# Patient Record
Sex: Male | Born: 1975 | Race: White | Hispanic: No | Marital: Single | Smoking: Current every day smoker
Health system: Southern US, Community
[De-identification: ages and names within clinical notes are randomized; demographics above are authoritative.]

## PROBLEM LIST (undated history)

## (undated) DIAGNOSIS — J9819 Other pulmonary collapse: Secondary | ICD-10-CM

## (undated) DIAGNOSIS — F1010 Alcohol abuse, uncomplicated: Secondary | ICD-10-CM

## (undated) DIAGNOSIS — I10 Essential (primary) hypertension: Secondary | ICD-10-CM

## (undated) DIAGNOSIS — K529 Noninfective gastroenteritis and colitis, unspecified: Secondary | ICD-10-CM

## (undated) DIAGNOSIS — R011 Cardiac murmur, unspecified: Secondary | ICD-10-CM

## (undated) DIAGNOSIS — R569 Unspecified convulsions: Secondary | ICD-10-CM

## (undated) DIAGNOSIS — R945 Abnormal results of liver function studies: Secondary | ICD-10-CM

## (undated) DIAGNOSIS — R7989 Other specified abnormal findings of blood chemistry: Secondary | ICD-10-CM

## (undated) DIAGNOSIS — F418 Other specified anxiety disorders: Secondary | ICD-10-CM

## (undated) HISTORY — PX: FOOT SURGERY: SHX648

---

## 1999-09-26 DIAGNOSIS — J9819 Other pulmonary collapse: Secondary | ICD-10-CM

## 1999-09-26 HISTORY — DX: Other pulmonary collapse: J98.19

## 2010-02-13 ENCOUNTER — Ambulatory Visit: Payer: Self-pay | Admitting: Occupational Medicine

## 2010-02-13 DIAGNOSIS — F411 Generalized anxiety disorder: Secondary | ICD-10-CM | POA: Insufficient documentation

## 2010-02-13 DIAGNOSIS — M25539 Pain in unspecified wrist: Secondary | ICD-10-CM | POA: Insufficient documentation

## 2010-02-13 DIAGNOSIS — I10 Essential (primary) hypertension: Secondary | ICD-10-CM | POA: Insufficient documentation

## 2010-09-14 ENCOUNTER — Encounter
Admission: RE | Admit: 2010-09-14 | Discharge: 2010-09-14 | Payer: Self-pay | Source: Home / Self Care | Attending: Sports Medicine | Admitting: Sports Medicine

## 2010-10-25 NOTE — Assessment & Plan Note (Signed)
Summary: L Hand pain x 1 wk rm 1   Vital Signs:  Patient Profile:   35 Years Old Male CC:      L Hand pain x 1 wk Height:     69 inches Weight:      203 pounds O2 Sat:      100 % O2 treatment:    Room Air Temp:     98.0 degrees F oral Pulse rate:   85 / minute Pulse rhythm:   regular Resp:     16 per minute BP sitting:   148 / 99  (right arm) Cuff size:   regular  Vitals Entered By: Areta Haber CMA (Feb 13, 2010 2:14 PM)                  Current Allergies: No known allergies History of Present Illness Chief Complaint: L Hand pain x 1 wk History of Present Illness: One week ago the patient has been drinking heavily and accidentally fell down the basement stairs at his home.  He injured his left wrist.   Throughout the week he has been having left wrist pain.  He is unemployed, so he did not have to go to work.     Presents with complaints of left wrist pain mainly over ulnar wrist and 5th metacarpal.  No bruising.   Wrist and hand are swollen and he is unable to make a composite fist.   Cap refill is normal.   When I looked him up on the [place] narcotics registry, he regularly takes suboxone and was prescribed 24 suboxone on 02/04/10  Current Problems: WRIST PAIN, LEFT (ICD-719.43) FAMILY HISTORY BREAST CANCER 1ST DEGREE RELATIVE <50 (ICD-V16.3) HYPERTENSION (ICD-401.9) ANXIETY (ICD-300.00)   REVIEW OF SYSTEMS Constitutional Symptoms      Denies fever, chills, night sweats, weight loss, weight gain, and fatigue.  Eyes       Denies change in vision, eye pain, eye discharge, glasses, contact lenses, and eye surgery. Ear/Nose/Throat/Mouth       Denies hearing loss/aids, change in hearing, ear pain, ear discharge, dizziness, frequent runny nose, frequent nose bleeds, sinus problems, sore throat, hoarseness, and tooth pain or bleeding.  Respiratory       Denies dry cough, productive cough, wheezing, shortness of breath, asthma, bronchitis, and emphysema/COPD.    Cardiovascular       Denies murmurs, chest pain, and tires easily with exhertion.    Gastrointestinal       Denies stomach pain, nausea/vomiting, diarrhea, constipation, blood in bowel movements, and indigestion. Genitourniary       Denies painful urination, kidney stones, and loss of urinary control. Neurological       Denies paralysis, seizures, and fainting/blackouts. Musculoskeletal       Complains of decreased range of motion.      Denies muscle pain, joint pain, joint stiffness, redness, swelling, muscle weakness, and gout.      Comments: L Hand pain x 1 wk Skin       Denies bruising, unusual mles/lumps or sores, and hair/skin or nail changes.  Psych       Denies mood changes, temper/anger issues, anxiety/stress, speech problems, depression, and sleep problems. Other Comments: Pt states that he fell down  a flight of stairs x wk ago. Pt states he has not had his hand seen by PCP.   Past History:  Past Medical History: Anxiety Hypertension  Past Surgical History: foot surgery  Family History: Family History Breast cancer 1st degree relative <50  Family History Hypertension Family History Other cancer  Social History: Single Current Smoker - 1 pack daily Alcohol use-yes - six pack daily Drug use-no Regular exercise-no Smoking Status:  current Drug Use:  no Does Patient Exercise:  no Physical Exam Chest/Lungs: no rales, wheezes, or rhonchi bilateral, breath sounds equal without effort Heart: regular rate and  rhythm, no murmur Extremities: Presents with complaints of left wrist pain mainly over ulnar wrist and 5th metacarpal.  No bruising.   Wrist and hand are swollen and he is unable to make a composite fist.   Cap refill is normal. Very limited range of motion to the left wrist due to pain and swelling.   Assessment New Problems: WRIST PAIN, LEFT (ICD-719.43) FAMILY HISTORY BREAST CANCER 1ST DEGREE RELATIVE <50 (ICD-V16.3) HYPERTENSION (ICD-401.9) ANXIETY  (ICD-300.00)   Plan New Orders: New Patient Level II [99202] T-DG Wrist Complete*L* [73110] Planning Comments:   Findings: Tiny corticated ossicle projects dorsal to the triquetrum.  Carpal rows intact. Negative for acute fracture, dislocation, or other acute abnormality.  Normal alignment and mineralization. No significant degenerative change.  Regional soft tissues unremarkable.   IMPRESSION:   No acute abnormality.  Recommend Cock up wrist splint Ibuprofen OTC for pain Ice and elevation Follow up with Ortho if no better in 1 week   The patient and/or caregiver has been counseled thoroughly with regard to medications prescribed including dosage, schedule, interactions, rationale for use, and possible side effects and they verbalize understanding.  Diagnoses and expected course of recovery discussed and will return if not improved as expected or if the condition worsens. Patient and/or caregiver verbalized understanding.

## 2012-05-10 ENCOUNTER — Emergency Department (HOSPITAL_BASED_OUTPATIENT_CLINIC_OR_DEPARTMENT_OTHER)
Admission: EM | Admit: 2012-05-10 | Discharge: 2012-05-10 | Disposition: A | Discharge status: Home / Self Care | Payer: Self-pay | Attending: Emergency Medicine | Admitting: Emergency Medicine

## 2012-05-10 ENCOUNTER — Encounter (HOSPITAL_BASED_OUTPATIENT_CLINIC_OR_DEPARTMENT_OTHER): Payer: Self-pay | Admitting: *Deleted

## 2012-05-10 DIAGNOSIS — F17200 Nicotine dependence, unspecified, uncomplicated: Secondary | ICD-10-CM | POA: Insufficient documentation

## 2012-05-10 DIAGNOSIS — I10 Essential (primary) hypertension: Secondary | ICD-10-CM | POA: Insufficient documentation

## 2012-05-10 HISTORY — DX: Noninfective gastroenteritis and colitis, unspecified: K52.9

## 2012-05-10 HISTORY — DX: Unspecified convulsions: R56.9

## 2012-05-10 HISTORY — DX: Other specified abnormal findings of blood chemistry: R79.89

## 2012-05-10 HISTORY — DX: Essential (primary) hypertension: I10

## 2012-05-10 HISTORY — DX: Other pulmonary collapse: J98.19

## 2012-05-10 HISTORY — DX: Abnormal results of liver function studies: R94.5

## 2012-05-10 HISTORY — DX: Alcohol abuse, uncomplicated: F10.10

## 2012-05-10 HISTORY — DX: Cardiac murmur, unspecified: R01.1

## 2012-05-10 HISTORY — DX: Other specified anxiety disorders: F41.8

## 2012-05-10 LAB — CBC
HCT: 46 % (ref 39.0–52.0)
Platelets: 252 10*3/uL (ref 150–400)
RBC: 5.15 MIL/uL (ref 4.22–5.81)
WBC: 9.8 10*3/uL (ref 4.0–10.5)

## 2012-05-10 LAB — BASIC METABOLIC PANEL
CO2: 25 mEq/L (ref 19–32)
Calcium: 9.9 mg/dL (ref 8.4–10.5)
Chloride: 101 mEq/L (ref 96–112)
Creatinine, Ser: 0.8 mg/dL (ref 0.50–1.35)
Glucose, Bld: 107 mg/dL — ABNORMAL HIGH (ref 70–99)

## 2012-05-10 MED ORDER — CLONIDINE HCL 0.1 MG PO TABS
ORAL_TABLET | ORAL | Status: AC
  Administered 2012-05-10: 0.1 mg via ORAL
  Filled 2012-05-10: qty 1

## 2012-05-10 MED ORDER — CLONIDINE HCL 0.1 MG PO TABS: 0.1000 mg | ORAL_TABLET | Freq: Two times a day (BID) | ORAL | Status: DC

## 2012-05-10 MED ORDER — CLONIDINE HCL 0.1 MG PO TABS
0.1000 mg | ORAL_TABLET | Freq: Once | ORAL | Status: AC
  Administered 2012-05-10: 0.1 mg via ORAL

## 2012-05-10 NOTE — ED Notes (Signed)
Patient has multiple symptoms.  During conversation new complaints arise.  Vaguely mentions chest pain, left arm pain, and SOB.

## 2012-05-10 NOTE — ED Notes (Signed)
Patient has history of elevated bp.  Currently in rehab at [facility] for the last 2 days.  States he was at [facility] for detox prior to going home for 4 days.  States he continued to drink at home and entered [facility] 48 hours ago.  States his bp was elevated on arrival to [facility], rechecked this morning and B/P was 170/ 116.  States he has had intermittent headache, lightheadedness and shakes.  States he was on clonidine for treatment of bp, has not taken in 2-3 months.

## 2012-05-10 NOTE — ED Notes (Signed)
States he has anxiety and doesn't think anything helps that.
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
## 2013-05-07 ENCOUNTER — Emergency Department (INDEPENDENT_AMBULATORY_CARE_PROVIDER_SITE_OTHER)
Admission: EM | Admit: 2013-05-07 | Discharge: 2013-05-07 | Disposition: A | Discharge status: Home / Self Care | Payer: Self-pay | Source: Home / Self Care | Attending: Family Medicine | Admitting: Family Medicine

## 2013-05-07 ENCOUNTER — Encounter: Payer: Self-pay | Admitting: *Deleted

## 2013-05-07 ENCOUNTER — Emergency Department (INDEPENDENT_AMBULATORY_CARE_PROVIDER_SITE_OTHER): Payer: Self-pay

## 2013-05-07 ENCOUNTER — Other Ambulatory Visit: Payer: Self-pay | Admitting: Family Medicine

## 2013-05-07 DIAGNOSIS — J189 Pneumonia, unspecified organism: Secondary | ICD-10-CM

## 2013-05-07 LAB — POCT URINALYSIS DIPSTICK
Blood, UA: NEGATIVE
Glucose, UA: NEGATIVE
Ketones, UA: NEGATIVE
Nitrite, UA: NEGATIVE
Urobilinogen, UA: >=8 (ref 0–1)

## 2013-05-07 LAB — POCT CBC W AUTO DIFF (K'VILLE URGENT CARE)

## 2013-05-07 LAB — POCT MONO SCREEN (KUC): Mono, POC: NEGATIVE

## 2013-05-07 MED ORDER — LEVOFLOXACIN 500 MG PO TABS: 500.0000 mg | ORAL_TABLET | Freq: Every day | ORAL | Status: DC

## 2013-05-07 MED ORDER — CEFTRIAXONE SODIUM 1 G IJ SOLR: 1.0000 g | Freq: Once | INTRAMUSCULAR | Status: DC

## 2013-05-07 MED ORDER — BENZONATATE 100 MG PO CAPS: ORAL_CAPSULE | ORAL | Status: DC

## 2013-05-07 MED ORDER — CEFTRIAXONE SODIUM 1 G IJ SOLR
1.0000 g | Freq: Once | INTRAMUSCULAR | Status: AC
  Administered 2013-05-07: 1 g via INTRAMUSCULAR
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
## 2013-05-07 NOTE — ED Notes (Addendum)
Bobby Shaw c/o 4 days body aches, fever, productive cough. Rash to trunk x 2-3 days, itches. T-max 104

## 2013-05-08 ENCOUNTER — Telehealth: Payer: Self-pay | Admitting: *Deleted

## 2016-06-20 ENCOUNTER — Ambulatory Visit (INDEPENDENT_AMBULATORY_CARE_PROVIDER_SITE_OTHER): Payer: Self-pay | Admitting: Osteopathic Medicine

## 2016-06-20 ENCOUNTER — Other Ambulatory Visit: Payer: Self-pay | Admitting: Osteopathic Medicine

## 2016-06-20 ENCOUNTER — Encounter: Payer: Self-pay | Admitting: Osteopathic Medicine

## 2016-06-20 VITALS — BP 167/109 | HR 104 | Ht 69.0 in

## 2016-06-20 DIAGNOSIS — R7989 Other specified abnormal findings of blood chemistry: Secondary | ICD-10-CM

## 2016-06-20 DIAGNOSIS — R030 Elevated blood-pressure reading, without diagnosis of hypertension: Secondary | ICD-10-CM

## 2016-06-20 DIAGNOSIS — R748 Abnormal levels of other serum enzymes: Secondary | ICD-10-CM

## 2016-06-20 DIAGNOSIS — IMO0001 Reserved for inherently not codable concepts without codable children: Secondary | ICD-10-CM

## 2016-06-20 DIAGNOSIS — T79A21S Traumatic compartment syndrome of right lower extremity, sequela: Secondary | ICD-10-CM

## 2016-06-20 LAB — CBC WITH DIFFERENTIAL/PLATELET
BASOS ABS: 0 {cells}/uL (ref 0–200)
Basophils Relative: 0 %
EOS PCT: 0 %
Eosinophils Absolute: 0 cells/uL — ABNORMAL LOW (ref 15–500)
HCT: 36.5 % — ABNORMAL LOW (ref 38.5–50.0)
HEMOGLOBIN: 11.8 g/dL — AB (ref 13.2–17.1)
LYMPHS PCT: 26 %
Lymphs Abs: 2106 cells/uL (ref 850–3900)
MCH: 29.1 pg (ref 27.0–33.0)
MCHC: 32.3 g/dL (ref 32.0–36.0)
MCV: 90.1 fL (ref 80.0–100.0)
MONOS PCT: 7 %
MPV: 9.4 fL (ref 7.5–12.5)
Monocytes Absolute: 567 cells/uL (ref 200–950)
NEUTROS PCT: 67 %
Neutro Abs: 5427 cells/uL (ref 1500–7800)
PLATELETS: 460 10*3/uL — AB (ref 140–400)
RBC: 4.05 MIL/uL — AB (ref 4.20–5.80)
RDW: 14.8 % (ref 11.0–15.0)
WBC: 8.1 10*3/uL (ref 3.8–10.8)

## 2016-06-20 LAB — COMPLETE METABOLIC PANEL WITH GFR
ALBUMIN: 4.3 g/dL (ref 3.6–5.1)
ALK PHOS: 79 U/L (ref 40–115)
ALT: 13 U/L (ref 9–46)
AST: 25 U/L (ref 10–40)
BUN: 10 mg/dL (ref 7–25)
CHLORIDE: 99 mmol/L (ref 98–110)
CO2: 23 mmol/L (ref 20–31)
Calcium: 10 mg/dL (ref 8.6–10.3)
Creat: 0.98 mg/dL (ref 0.60–1.35)
GFR, Est African American: >89 mL/min (ref >=60)
GLUCOSE: 107 mg/dL — AB (ref 65–99)
POTASSIUM: 4.8 mmol/L (ref 3.5–5.3)
SODIUM: 140 mmol/L (ref 135–146)
Total Bilirubin: 0.5 mg/dL (ref 0.2–1.2)
Total Protein: 7.4 g/dL (ref 6.1–8.1)

## 2016-06-20 MED ORDER — HYDROCODONE-ACETAMINOPHEN 5-325 MG PO TABS: 1.0000 | ORAL_TABLET | ORAL | 0 refills | Status: DC | PRN

## 2016-06-20 MED ORDER — GABAPENTIN 600 MG PO TABS: 600.0000 mg | ORAL_TABLET | Freq: Three times a day (TID) | ORAL | 1 refills | Status: DC | PRN

## 2016-06-20 NOTE — Patient Instructions (Signed)
Plan:  Pain management for now with hydrocodone for severe pain, gabapentin for nerve pain. We'll keep gabapentin at 600 mg 3 times per day for now until kidney function shows some improvement.   Labs to monitor for medication safety, monitor kidney function.  Need records from your hospital stay/surgical records  Plan to follow-up in one to 2 weeks, may ask for lab draw sooner if kidney function is not showing improvement
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
## 2016-06-21 ENCOUNTER — Telehealth: Payer: Self-pay

## 2016-06-21 NOTE — Telephone Encounter (Signed)
Pt reports the pain meds that was Rx yesterday is not helping. Please advise.

## 2016-06-21 NOTE — Telephone Encounter (Signed)
No, I told him if his pain is so severe that he cannot cope at home, he should go to the hospital because he may require admission for pain management, consultation with surgeon, etc.

## 2016-06-21 NOTE — Telephone Encounter (Signed)
Pt called clinic again stating it was mentioned to him at [facility] yesterday there was a program where he could be admitted to the hospital to learn how to manage his pain. Questions if this is really an option. Will route.   Pt's cell: [phone]

## 2016-06-21 NOTE — Telephone Encounter (Signed)
Spoke to patient gave him advise as noted below. Rhonda Cunningham,CMA  

## 2016-06-27 ENCOUNTER — Ambulatory Visit (INDEPENDENT_AMBULATORY_CARE_PROVIDER_SITE_OTHER): Payer: Medicaid Other | Admitting: Sports Medicine

## 2016-06-27 DIAGNOSIS — M79604 Pain in right leg: Secondary | ICD-10-CM | POA: Diagnosis not present

## 2016-06-27 MED ORDER — PREDNISONE 50 MG PO TABS: ORAL_TABLET | ORAL | 0 refills | Status: DC

## 2016-06-27 MED ORDER — AMITRIPTYLINE HCL 50 MG PO TABS: ORAL_TABLET | ORAL | 3 refills | Status: DC

## 2016-06-27 NOTE — Assessment & Plan Note (Signed)
History of extensive fasciotomy for posterior thigh compartment syndrome a month ago. I don't have any records, they will get records sent to us. He is having severe pain running down the leg, in a sciatic distribution but not really a radicular distribution. There is also significant swelling in the right foot. Adding prednisone, continue gabapentin 600 mg 4 times a day, adding amitriptyline 50 mg at bedtime. Referral placed to pain management, I would like a nerve conduction and EMG of the right lower extremity. We have been very clear that we will be avoiding narcotic pain management. Also checking some blood work including B12 levels, serum drug screen.

## 2016-06-27 NOTE — Progress Notes (Signed)
   Subjective:    I'm seeing this patient as a consultation for:  Dr. Sunnie NielsenNatalie Shaw   CC: burning R foot pain s/p R thigh fasciotomy one month ago  HPI: 40 yo now one month s/p R thigh fasciotomy presenting with three days of constant burning R foot pain.  Patient describes pain as a constant burning from the tip of his toes up to his ankle in his R foot.  He says when he tries bearing weight, the burning intensifies and it feels like there are shooting pains throughout his foot.  These symptoms started three days ago.  He also thinks his foot is swollen, but it does not change colors.  He says pain is unbearable and gabapentin does not help.  He says Percocet provides minimal relief.  He has trouble sleeping at night due to pain.  He denies any fevers, chills, night sweats. He never had follow-up with his surgeon after the surgery because [place] in [place] and he moved down to [place] to live with his mom following discharge.  He does note that the surgeon said something about "possible sciatic nerve injury" but it is unclear if there was any injury to the sciatic nerve during the surgery.  Patient's mother will work on getting records from hospital for review.   Past medical history:  Negative.  See flowsheet/record as well for more information.  Surgical history: Negative.  See flowsheet/record as well for more information.  Family history: Negative.  See flowsheet/record as well for more information.  Social history: Negative.  See flowsheet/record as well for more information.  Allergies, and medications have been entered into the medical record, reviewed, and no changes needed.   Review of Systems: No headache, visual changes, nausea, vomiting, diarrhea, constipation, dizziness, abdominal pain, skin rash, fevers, chills, night sweats, weight loss, swollen lymph nodes, body aches, joint swelling, muscle aches, chest pain, shortness of breath, mood changes, visual or  auditory hallucinations.   Objective:   General: Well Developed, well nourished, and in no acute distress.  Neuro/Psych: Alert and oriented x3, extra-ocular muscles intact, able to move all 4 extremities, sensation grossly intact. Skin: Warm and dry, no rashes noted. Well-healing fasciotomy scars on thigh.   Respiratory: Not using accessory muscles, speaking in full sentences, trachea midline. Lungs clear bilaterally.  Cardiovascular: Tachycardic, regular rhythm.  No murmurs.  Pulses palpable, no extremity edema. Abdomen: Does not appear distended. R Foot: Swelling of R foot.  Range of motion is full in all directions. Strength is 5/5 in all directions. No hallux valgus. No pes cavus or pes planus. No abnormal callus noted.  Dry skin on dorsal aspect of foot.  No pain over the navicular prominence, or base of fifth metatarsal. Tender to palpation throughout, worse over first metatarsal head. Neurovascularly intact distally. 2+ DP pulse.     Impression and Recommendations:   This case required medical decision making of moderate complexity.  1. Neuropathic pain s/p R thigh fasciotomy 1 month ago:  Patient's mother will work on getting outside records from his surgery and hospitalization, which may help determine cause of pain.  Patient is also insistent that he be given narcotics for pain, although we will not give them to him at this time.   -Course of prednisone -Continue gabapentin 600mg  four times per day -Amitrypttiline 50mg  at night -Nerve conduction studies/EMG  -Referral to pain management

## 2016-06-28 LAB — COMPREHENSIVE METABOLIC PANEL
ALT: 16 U/L (ref 9–46)
AST: 29 U/L (ref 10–40)
Alkaline Phosphatase: 91 U/L (ref 40–115)
BUN: 9 mg/dL (ref 7–25)
CO2: 25 mmol/L (ref 20–31)
Glucose, Bld: 80 mg/dL (ref 65–99)
Potassium: 4.2 mmol/L (ref 3.5–5.3)

## 2016-06-28 LAB — COMPREHENSIVE METABOLIC PANEL WITH GFR
Albumin: 4 g/dL (ref 3.6–5.1)
Calcium: 9.4 mg/dL (ref 8.6–10.3)
Chloride: 100 mmol/L (ref 98–110)
Creat: 0.73 mg/dL (ref 0.60–1.35)
Sodium: 138 mmol/L (ref 135–146)
Total Bilirubin: 0.5 mg/dL (ref 0.2–1.2)
Total Protein: 6.8 g/dL (ref 6.1–8.1)

## 2016-06-28 LAB — CBC
HCT: 34.5 % — ABNORMAL LOW (ref 38.5–50.0)
Hemoglobin: 11 g/dL — ABNORMAL LOW (ref 13.2–17.1)
MCH: 28.6 pg (ref 27.0–33.0)
MCHC: 31.9 g/dL — ABNORMAL LOW (ref 32.0–36.0)
MCV: 89.6 fL (ref 80.0–100.0)
MPV: 9.5 fL (ref 7.5–12.5)
Platelets: 310 10*3/uL (ref 140–400)
RBC: 3.85 MIL/uL — ABNORMAL LOW (ref 4.20–5.80)
RDW: 14.3 % (ref 11.0–15.0)
WBC: 8.9 10*3/uL (ref 3.8–10.8)

## 2016-06-28 LAB — CK: Total CK: 249 U/L — ABNORMAL HIGH (ref 7–232)

## 2016-06-28 LAB — VITAMIN B12: Vitamin B-12: 378 pg/mL (ref 200–1100)

## 2016-06-28 LAB — HEMOGLOBIN A1C
Hgb A1c MFr Bld: 4.4 % (ref <5.7)
Mean Plasma Glucose: 80 mg/dL

## 2016-06-29 ENCOUNTER — Telehealth: Payer: Self-pay

## 2016-06-29 LAB — HEAVY METALS PANEL, BLOOD
Arsenic: 4 mcg/L (ref <23)
Lead: <1 ug/dL (ref <5)
Mercury, B: <4 mcg/L (ref <=10)

## 2016-06-29 NOTE — Telephone Encounter (Signed)
Noted. I did ask him about what brought his compartment syndrome on (why on the ground so long...), he did admit to some alcohol abuse but states that he's been sober since his hospitalization. I would also probably address this further with him if/when he follows up with me. Would agree that pain management is the best option for him

## 2016-06-29 NOTE — Telephone Encounter (Signed)
Pt is unable to go to pain management due to Medicaid pending. Please advise on where else he can be seen.

## 2016-06-29 NOTE — Telephone Encounter (Signed)
Leta: He will have to pay cash to see them until his insurance goes through. Other options include trying a different pain management clinic, possibly [facility].  To Dorene Grebeatalie:  I would recommend avoiding treatment of his symptoms with controlled substances unless you are willing to do his medical pain management.  Often laying in one position for a long time (which brought on his compartment syndrome) is indicative of some substance abuse.

## 2016-06-30 LAB — DRUG SCREEN 10 W/CONF, SERUM

## 2016-06-30 NOTE — Telephone Encounter (Signed)
Have not received records. If there is concern for mood/depression issues, he will need to come see me in the office. I know Dr. Karie Schwalbe has already discussed pain management with this family, I would agree with his recommendations in that regard.

## 2016-06-30 NOTE — Telephone Encounter (Signed)
Mother of patient called asking if we have received his medical records from procedure. Also, stated she's worried about pt mental health seeing that he's dealing with so much pain and wants to know if there's anything he can get to help.

## 2016-06-30 NOTE — Telephone Encounter (Signed)
Mother notified of information. Stated pt was given mood medications while he was in the hospital and that he was seen by behavioral health. Doesn't think he needs to be seen for his mood.

## 2016-07-07 ENCOUNTER — Encounter: Payer: Self-pay | Admitting: Osteopathic Medicine

## 2016-07-07 DIAGNOSIS — F1910 Other psychoactive substance abuse, uncomplicated: Secondary | ICD-10-CM | POA: Insufficient documentation

## 2016-07-11 ENCOUNTER — Ambulatory Visit (INDEPENDENT_AMBULATORY_CARE_PROVIDER_SITE_OTHER): Payer: Medicaid Other | Admitting: Sports Medicine

## 2016-07-11 ENCOUNTER — Encounter: Payer: Self-pay | Admitting: Sports Medicine

## 2016-07-11 DIAGNOSIS — M79604 Pain in right leg: Secondary | ICD-10-CM | POA: Diagnosis not present

## 2016-07-11 MED ORDER — AMITRIPTYLINE HCL 50 MG PO TABS: ORAL_TABLET | ORAL | 3 refills | Status: DC

## 2016-07-11 NOTE — Progress Notes (Signed)
Thanks for letting me know! If it helps, I do have his records from his incident in [place]. Haven't gone through everything in detail yet but sounds like it was quite an experience. I've got the copies in my office if these will help you in anyway, just let me know

## 2016-07-11 NOTE — Assessment & Plan Note (Signed)
Still awaiting nerve conduction study. Continue gabapentin 600 mg 4 times per day, increase amitriptyline to twice per day. Also still awaiting them to find a pain management provider. He did get #60 hydrocodone, 7 days later his serum drug screen was negative suggesting that he doesn't need this many, or he is diverting them. They're very aware that we will not be providing controlled substances, the patient became angry and stormed out. He will try his mother's TENS unit, I'm happy to write a prescription if needed.

## 2016-07-11 NOTE — Progress Notes (Signed)
  Subjective:    CC: R leg pain s/p R thigh fasciotomy   HPI: 40 yo now 6 weeks s/p R thigh fasciotomy presenting for re-evaluation of R leg pain.  Patient continues to endorse constant, burning pain from the tip of his toes up to his ankle in his R foot. Pain has not gotten any better since he was last seen and started on amitriptyline.  He does say the amitriptyline makes him feel drowsy, but he is still not sleeping well.  Amitriptyline has not helped with the pain. Patient is requesting narcotics at this time.  He was prescribed 60 hydrocodone's on September 26 but had a negative UDS one week later.  He visited the ED two days ago because the pain was unbearable.  He was prescribed 12 Norco's, which he says he finished taking yesterday.  He says he needs pain medication today.  He has been unable to establish care with local pain clinics because he is uninsured and Medicaid is pending.  He has not had his EMG study performed yet because he says the office has not called to schedule an appointment.   Past medical history:  Negative.  See flowsheet/record as well for more information.  Surgical history: Negative.  See flowsheet/record as well for more information.  Family history: Negative.  See flowsheet/record as well for more information.  Social history: Negative.  See flowsheet/record as well for more information.  Allergies, and medications have been entered into the medical record, reviewed, and no changes needed.   Review of Systems: No fevers, chills, night sweats, weight loss, chest pain, or shortness of breath.   Objective:    General: Well Developed, well nourished, and in no acute distress.  Neuro: Alert and oriented x3, extra-ocular muscles intact, sensation grossly intact.  HEENT: Normocephalic, atraumatic, pupils equal round reactive to light, neck supple, no masses, no lymphadenopathy, thyroid nonpalpable.  Skin: Warm and dry, no rashes. Cardiac: Regular rate and rhythm, no  murmurs rubs or gallops, no lower extremity edema.  Respiratory: Clear to auscultation bilaterally. Not using accessory muscles, speaking in full sentences. R Foot: Swelling of R foot.  TTP throughout.  Range of motion is full in all directions. Strength is 5/5 in all directions. No hallux valgus. No pes cavus or pes planus. Neurovascularly intact distally. 2+ DP pulse.   Impression and Recommendations:    1. R leg pain: Continues to complain of neuropathic pain throughout his R leg and foot.  Is requesting pain medications.  Last received pain medication 2 days ago when in the ED.  UDS was negative two weeks ago after receiving a prescription for 60 hydrocodone one week prior.  Discussed importance of getting EMG scheduled and getting established with pain management.  Patient stormed out of room when request for more pain medication was denied. -Will not refill narcotic prescription -Increase amitriptyline to twice per day -Continue gabapentin  -Schedule EMG study -Continue to work on getting set up with pain management clinic

## 2016-07-14 ENCOUNTER — Telehealth: Payer: Self-pay

## 2016-07-14 DIAGNOSIS — G894 Chronic pain syndrome: Secondary | ICD-10-CM

## 2016-07-14 NOTE — Telephone Encounter (Signed)
Pt is in need of a referral to Dr. Burtis Junesarsha Garvin for Pain management. Please assist.

## 2016-07-14 NOTE — Telephone Encounter (Signed)
Referral placed.

## 2016-07-19 ENCOUNTER — Other Ambulatory Visit: Payer: Self-pay | Admitting: Osteopathic Medicine

## 2016-07-19 ENCOUNTER — Other Ambulatory Visit: Payer: Self-pay

## 2016-07-19 DIAGNOSIS — T79A21S Traumatic compartment syndrome of right lower extremity, sequela: Secondary | ICD-10-CM

## 2016-07-19 MED ORDER — GABAPENTIN 600 MG PO TABS: 600.0000 mg | ORAL_TABLET | Freq: Three times a day (TID) | ORAL | 0 refills | Status: DC | PRN

## 2016-08-04 ENCOUNTER — Other Ambulatory Visit: Payer: Self-pay | Admitting: Osteopathic Medicine

## 2016-08-04 DIAGNOSIS — T79A21S Traumatic compartment syndrome of right lower extremity, sequela: Secondary | ICD-10-CM

## 2016-08-08 ENCOUNTER — Telehealth: Payer: Self-pay

## 2016-08-08 DIAGNOSIS — T79A21S Traumatic compartment syndrome of right lower extremity, sequela: Secondary | ICD-10-CM

## 2016-08-08 MED ORDER — GABAPENTIN 600 MG PO TABS
1200.0000 mg | ORAL_TABLET | Freq: Three times a day (TID) | ORAL | 0 refills | Status: DC
Start: 2016-08-08 — End: 2016-11-06

## 2016-08-08 NOTE — Telephone Encounter (Signed)
Yes, that is fine, refilling medication.

## 2016-08-08 NOTE — Telephone Encounter (Signed)
Bobby Shaw's mom called and states he needs a refill on gabapentin. I advised her that there was a prescription sent on 07/19/2016. She states Dr Benjamin Stainhekkekandam advised him to take 3,600 mg daily. He has ran out. Did you want to change his prescription to reflect the increase? He takes 600 mg 2 tablets TID. Please advise.

## 2016-08-09 NOTE — Telephone Encounter (Signed)
Pt's mother, Okey RegalCarol, advised of new Rx. States Pt is set up for nerve test at the end of November and will follow up with Provider after for results and recommendations.

## 2016-08-15 ENCOUNTER — Ambulatory Visit: Payer: Self-pay | Admitting: Osteopathic Medicine

## 2016-08-16 ENCOUNTER — Ambulatory Visit (INDEPENDENT_AMBULATORY_CARE_PROVIDER_SITE_OTHER): Payer: Self-pay | Admitting: Neurology

## 2016-08-16 ENCOUNTER — Encounter: Payer: Self-pay | Admitting: Neurology

## 2016-08-16 DIAGNOSIS — G5701 Lesion of sciatic nerve, right lower limb: Secondary | ICD-10-CM

## 2016-08-16 NOTE — Procedures (Signed)
     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 The patient had acute renal failure requiring hemodialysis for several months secondary to rhabdomyolysis. The patient has had persistent weakness and pain in the right leg, he is sent for an evaluation of a possible sciatic neuropathy.  NERVE CONDUCTION STUDIES:  Nerve conduction studies were performed on the right lower extremity, the patient refused study on the left leg. The distal motor latencies for the right peroneal and posterior tibial nerves were prolonged with low motor amplitudes for these nerves. The nerve conduction velocities for the peroneal and posterior tibial nerves on the right were normal. The H reflex latency was unobtainable, the peroneal sensory latency was unobtainable.  EMG STUDIES:  EMG study was performed on the right lower extremity:  The tibialis anterior muscle reveals 1 to 2K motor units with markedly decreased recruitment. 2+ fibrillations and positive waves were seen. The peroneus tertius muscle reveals 1 to 2K motor units with markedly decreased recruitment. 3+ fibrillations and positive waves were seen. The medial gastrocnemius muscle reveals 1 to 3K motor units with markedly decreased recruitment. 3+ fibrillations and positive waves were seen. The vastus lateralis muscle reveals 2 to 3K motor units with slightly decreased recruitment. No fibrillations or positive waves were seen. The iliopsoas muscle reveals 2 to 4K motor units with full recruitment. 1+ fibrillations and positive waves were seen. The biceps femoris muscle (long head) reveals 1 to 2K motor units with decreased recruitment. 3+ fibrillations and positive waves were  seen. The gluteus medius muscle reveals 1 to 3K motor units with full recruitment. No fibrillations or positive waves were seen. The lumbosacral paraspinal muscles were tested at 3 levels, and revealed no abnormalities of insertional activity at all 3 levels tested. There was good relaxation.   IMPRESSION:  Nerve conduction studies on the right lower extremities show evidence of a significant sensorimotor neuropathy. EMG evaluation of the right lower extremities show severe denervation in all muscles in the sciatic nerve distribution consistent with a significant sciatic neuropathy. There is no evidence of an overlying lumbosacral radiculopathy. This study would suggest that the prognosis for full recovery is poor.  Marlan Palau. Keith Willis MD 08/16/2016 4:53 PM  [facility] [address] [place], [place] [id number]  Phone [phone] Fax [phone]

## 2016-08-16 NOTE — Progress Notes (Signed)
Please refer to EMG and NCV procedure note. 

## 2016-08-22 ENCOUNTER — Ambulatory Visit (HOSPITAL_BASED_OUTPATIENT_CLINIC_OR_DEPARTMENT_OTHER)
Admission: RE | Admit: 2016-08-22 | Discharge: 2016-08-22 | Disposition: A | Discharge status: Home / Self Care | Payer: Self-pay | Source: Ambulatory Visit | Attending: Sports Medicine | Admitting: Sports Medicine

## 2016-08-22 ENCOUNTER — Ambulatory Visit (INDEPENDENT_AMBULATORY_CARE_PROVIDER_SITE_OTHER): Payer: Medicaid Other | Admitting: Sports Medicine

## 2016-08-22 DIAGNOSIS — G5701 Lesion of sciatic nerve, right lower limb: Secondary | ICD-10-CM | POA: Diagnosis not present

## 2016-08-22 DIAGNOSIS — M7989 Other specified soft tissue disorders: Secondary | ICD-10-CM

## 2016-08-22 MED ORDER — DULOXETINE HCL 30 MG PO CPEP: 30.0000 mg | ORAL_CAPSULE | Freq: Every day | ORAL | 3 refills | Status: DC

## 2016-08-22 MED ORDER — ACETAMINOPHEN ER 650 MG PO TBCR: 650.0000 mg | EXTENDED_RELEASE_TABLET | Freq: Three times a day (TID) | ORAL | 3 refills | Status: DC | PRN

## 2016-08-22 MED ORDER — IBUPROFEN 800 MG PO TABS: 800.0000 mg | ORAL_TABLET | Freq: Three times a day (TID) | ORAL | 2 refills | Status: DC | PRN

## 2016-08-22 NOTE — Assessment & Plan Note (Signed)
Likely due to peripheral neuropathy however with relative immobilization we are going to get a lower extremity Doppler ultrasound.

## 2016-08-22 NOTE — Assessment & Plan Note (Signed)
NCV and EMG confirmed sciatic neuropathy.  Peripheral sciatic innervated muscle atrophy. Chronic pain. Continue gabapentin at 3600 mg per day, amitriptyline at 50 mg twice a day, adding Cymbalta 30. Return in one month. Neurology is considering intrathecal pain pump. He also needs to get established with pain management, however this will not happen until he is on Medicaid. I do think that he needs narcotic pain medication, but he understands that this will not be provided by our sports medicine office. I would also recommend that he do arthritis strength Tylenol as well as ibuprofen 800 mg 3 times a day.

## 2016-08-22 NOTE — Progress Notes (Signed)
  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 He had a recent nerve conduction and EMG performed that showed severe sciatic neuropathy. Neurology is planning an intrathecal pain pump. He has not yet gotten in to pain management. He is currently taking gabapentin max dose, and amitriptyline at 50 mg twice a day.  Past medical history:  Negative.  See flowsheet/record as well for more information.  Surgical history: Negative.  See flowsheet/record as well for more information.  Family history: Negative.  See flowsheet/record as well for more information.  Social history: Negative.  See flowsheet/record as well for more information.  Allergies, and medications have been entered into the medical record, reviewed, and no changes needed.   Review of Systems: No fevers, chills, night sweats, weight loss, chest pain, or shortness of breath.   Objective:    General: Well Developed, well nourished, and in no acute distress.  Neuro: Alert and oriented x3, extra-ocular muscles intact, sensation grossly intact.  HEENT: Normocephalic, atraumatic, pupils equal round reactive to light, neck supple, no masses, no lymphadenopathy, thyroid nonpalpable.  Skin: Warm and dry, no rashes. Cardiac: Regular rate and rhythm, no murmurs rubs or gallops, no lower extremity edema.  Respiratory: Clear to auscultation bilaterally. Not using accessory muscles, speaking in full sentences. Right leg: Swollen, positive Homans sign.  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  Peripheral sciatic innervated muscle atrophy. Chronic pain. Continue gabapentin at 3600 mg per day, amitriptyline at 50 mg twice a day, adding Cymbalta 30. Return in one month. Neurology is considering intrathecal pain pump. He also needs to get established with pain  management, however this will not happen until he is on Medicaid. I do think that he needs narcotic pain medication, but he understands that this will not be provided by our sports medicine office. I would also recommend that he do arthritis strength Tylenol as well as ibuprofen 800 mg 3 times a day.  Right leg swelling Likely due to peripheral neuropathy however with relative immobilization we are going to get a lower extremity Doppler ultrasound.  I spent 25 minutes with this patient, greater than 50% was face-to-face time counseling regarding the above diagnoses

## 2016-09-07 ENCOUNTER — Telehealth: Payer: Self-pay | Admitting: Neurology

## 2016-09-07 NOTE — Telephone Encounter (Signed)
I called patient, talk with the mother. The patient is having ongoing pain, I suspect that he will need a pain center referral at some point, they were questioned about whether a spinal stimulator may help, it may be possible, but I am not a treating physician.  I discussed the results of the EMG study with her.

## 2016-09-07 NOTE — Telephone Encounter (Signed)
Patient is requesting a returned call to discuss his NCV/EMG. I told patient the results were sent to the referring doctor and could discuss with that doctor but he wanted a call from our office.

## 2016-09-07 NOTE — Telephone Encounter (Signed)
Pt w/ R sciatic neuropathy was referred to our office by PCP Sunnie Nielsen(Natalie Alexander) only for EMG/NCS which he had performed 08/16/16 w/ the following results...  IMPRESSION:  Nerve conduction studies on the right lower extremities show evidence of a significant sensorimotor neuropathy. EMG evaluation of the right lower extremities show severe denervation in all muscles in the sciatic nerve distribution consistent with a significant sciatic neuropathy. There is no evidence of an overlying lumbosacral radiculopathy. This study would suggest that the prognosis for full recovery is poor.

## 2016-09-14 DIAGNOSIS — Z0271 Encounter for disability determination: Secondary | ICD-10-CM

## 2016-09-19 ENCOUNTER — Other Ambulatory Visit: Payer: Self-pay | Admitting: Sports Medicine

## 2016-09-19 DIAGNOSIS — M79604 Pain in right leg: Secondary | ICD-10-CM

## 2016-10-15 ENCOUNTER — Encounter: Payer: Self-pay | Admitting: Osteopathic Medicine

## 2016-10-15 DIAGNOSIS — F319 Bipolar disorder, unspecified: Secondary | ICD-10-CM | POA: Insufficient documentation

## 2016-11-06 ENCOUNTER — Ambulatory Visit (INDEPENDENT_AMBULATORY_CARE_PROVIDER_SITE_OTHER): Payer: Medicaid Other | Admitting: Sports Medicine

## 2016-11-06 DIAGNOSIS — T79A21S Traumatic compartment syndrome of right lower extremity, sequela: Secondary | ICD-10-CM

## 2016-11-06 DIAGNOSIS — G5701 Lesion of sciatic nerve, right lower limb: Secondary | ICD-10-CM | POA: Diagnosis not present

## 2016-11-06 MED ORDER — GABAPENTIN 600 MG PO TABS: 1200.0000 mg | ORAL_TABLET | Freq: Three times a day (TID) | ORAL | 0 refills | Status: DC

## 2016-11-06 MED ORDER — DULOXETINE HCL 60 MG PO CPEP: 60.0000 mg | ORAL_CAPSULE | Freq: Every day | ORAL | 3 refills | Status: DC

## 2016-11-06 NOTE — Assessment & Plan Note (Signed)
Patient returns for follow-up, he is doing a bit better on 3600 mg of gabapentin per day, amitriptyline 50 mg twice a day, he is on Cymbalta 30 which was added last time, his affect, mood, and pain seems significantly better since the addition of Cymbalta. I'm going to increase his Cymbalta to 60 mg today. Intrathecal pain pump versus spinal cord stimulator are still in the discussion phase. He is also working to get Medicaid, he will be able to see a pain doctor until then. I would fully support his disability considering his injuries. He does have nerve conduction study and EMG confirmed severe sciatic neuropathy without good prognosis for recovery. Return to see me in one month and discuss increasing Cymbalta again. He can also continue with arthritis strength Tylenol and ibuprofen 800 mg 3 times per day.

## 2016-11-06 NOTE — Progress Notes (Addendum)
  Subjective:    CC: Follow-up  HPI: Bobby Shaw is a 41 year old male, we have been treating him for the consultations of a compartment syndrome post large fasciotomy, he subsequently ended up with severe sciatic neuropathy, with denervation of nearly all of the sciatic nerve innervated muscles on nerve conduction study, indicating very poor prognosis for recovery. Since then we have placed him on max dose gabapentin, amitriptyline 50 mg twice a day both of which helped, more recently the last visit I started Cymbalta at 30 mg daily he returns today feeling significant better. Still working on disability, and Medicaid. Has not yet seen the pain doctor. There was a discussion of an intrathecal pain pump and spinal cord stimulator however would certainly need to discuss this with pain management. Today he mostly needs a refill on his gabapentin.  Past medical history:  Negative.  See flowsheet/record as well for more information.  Surgical history: Negative.  See flowsheet/record as well for more information.  Family history: Negative.  See flowsheet/record as well for more information.  Social history: Negative.  See flowsheet/record as well for more information.  Allergies, and medications have been entered into the medical record, reviewed, and no changes needed.   Review of Systems: No fevers, chills, night sweats, weight loss, chest pain, or shortness of breath.   Objective:    General: Well Developed, well nourished, and in no acute distress. Comes in smiling. Neuro: Alert and oriented x3, extra-ocular muscles intact, sensation grossly intact.  HEENT: Normocephalic, atraumatic, pupils equal round reactive to light, neck supple, no masses, no lymphadenopathy, thyroid nonpalpable.  Skin: Warm and dry, no rashes. Cardiac: Regular rate and rhythm, no murmurs rubs or gallops, no lower extremity edema.  Respiratory: Clear to auscultation bilaterally. Not using accessory muscles, speaking in full  sentences.  Impression and Recommendations:    Sciatic neuropathy, right Patient returns for follow-up, he is doing a bit better on 3600 mg of gabapentin per day, amitriptyline 50 mg twice a day, he is on Cymbalta 30 which was added last time, his affect, mood, and pain seems significantly better since the addition of Cymbalta. I'm going to increase his Cymbalta to 60 mg today. Intrathecal pain pump versus spinal cord stimulator are still in the discussion phase. He is also working to get Medicaid, he will be able to see a pain doctor until then. I would fully support his disability considering his injuries. He does have nerve conduction study and EMG confirmed severe sciatic neuropathy without good prognosis for recovery. Return to see me in one month and discuss increasing Cymbalta again. He can also continue with arthritis strength Tylenol and ibuprofen 800 mg 3 times per day.  I spent 25 minutes with this patient, greater than 50% was face-to-face time counseling regarding the above diagnoses

## 2016-11-07 ENCOUNTER — Encounter: Payer: Self-pay | Admitting: *Deleted

## 2016-11-07 ENCOUNTER — Encounter: Payer: Self-pay | Admitting: Neurology

## 2016-11-07 NOTE — Progress Notes (Signed)
Faxed letter written by CW,MD on 11/07/16 to Kai Levinsollins Price, [facility] [employer] at [employer]. Fax: [phone]. Received confirmation. Sent copy to MR to be scanned. This included signed authorization to disclose information to [employer], [facility] by patient.

## 2016-12-18 ENCOUNTER — Ambulatory Visit (INDEPENDENT_AMBULATORY_CARE_PROVIDER_SITE_OTHER): Payer: Self-pay | Admitting: Sports Medicine

## 2016-12-18 ENCOUNTER — Encounter: Payer: Self-pay | Admitting: Sports Medicine

## 2016-12-18 DIAGNOSIS — G5701 Lesion of sciatic nerve, right lower limb: Secondary | ICD-10-CM

## 2016-12-18 DIAGNOSIS — F1910 Other psychoactive substance abuse, uncomplicated: Secondary | ICD-10-CM

## 2016-12-18 MED ORDER — DULOXETINE HCL 60 MG PO CPEP: 120.0000 mg | ORAL_CAPSULE | Freq: Every day | ORAL | 3 refills | Status: DC

## 2016-12-18 NOTE — Progress Notes (Signed)
  Subjective:    CC: Follow-up  HPI: Sciatic neuropathy/chronic pain: Overall doing okay on current medications.  Alcohol use disorder: His drinking over a fifth of vodka every day, is telling me that this is the only way that he is able to be comfortable, he has improved in terms of his mood on 60 mg of Cymbalta but agrees that he has persistent depression, and needs additional help. He is agreeable to go up to 120 of Cymbalta as well as discuss his situation with intensive outpatient rehabilitation for drug and alcohol use.  Past medical history:  Negative.  See flowsheet/record as well for more information.  Surgical history: Negative.  See flowsheet/record as well for more information.  Family history: Negative.  See flowsheet/record as well for more information.  Social history: Negative.  See flowsheet/record as well for more information.  Allergies, and medications have been entered into the medical record, reviewed, and no changes needed.   Review of Systems: No fevers, chills, night sweats, weight loss, chest pain, or shortness of breath.   Objective:    General: Well Developed, well nourished, and in no acute distress.  Neuro: Alert and oriented x3, extra-ocular muscles intact, sensation grossly intact.  HEENT: Normocephalic, atraumatic, pupils equal round reactive to light, neck supple, no masses, no lymphadenopathy, thyroid nonpalpable.  Skin: Warm and dry, no rashes. Cardiac: Regular rate and rhythm, no murmurs rubs or gallops, no lower extremity edema.  Respiratory: Clear to auscultation bilaterally. Not using accessory muscles, speaking in full sentences.  Impression and Recommendations:    Sciatic neuropathy, right Continue gabapentin 3600 mg daily, amitriptyline 50 mg twice a day, increasing Cymbalta to 120 mg. There has been a discussion of intrathecal pain pump versus spinal cord stimulator. Still working on [employer], I would fully support his disability. Again, his  nerve conduction study and EMG confirms severe sciatic neuropathy without a good prognosis for recovery. He is very depressed, he is drinking greater than 1/5 of vodka per day, I'm going to place a referral for intensive outpatient rehabilitation. I have advised him to follow-up with his PCP regarding further treatment of his medical issues. I would like him to do lymphedema physical therapy to help with the neurogenic edema in his right lower extremity.  Polysubstance abuse He is very depressed, he is drinking greater than 1/5 of vodka per day, I'm going to place a referral for intensive outpatient rehabilitation. I have advised him to follow-up with his PCP regarding further treatment of his medical issues.  I spent 25 minutes with this patient, greater than 50% was face-to-face time counseling regarding the above diagnoses

## 2016-12-18 NOTE — Assessment & Plan Note (Signed)
He is very depressed, he is drinking greater than 1/5 of vodka per day, I'm going to place a referral for intensive outpatient rehabilitation. I have advised him to follow-up with his PCP regarding further treatment of his medical issues.

## 2016-12-18 NOTE — Assessment & Plan Note (Signed)
Continue gabapentin 3600 mg daily, amitriptyline 50 mg twice a day, increasing Cymbalta to 120 mg. There has been a discussion of intrathecal pain pump versus spinal cord stimulator. Still working on [employer], I would fully support his disability. Again, his nerve conduction study and EMG confirms severe sciatic neuropathy without a good prognosis for recovery. He is very depressed, he is drinking greater than 1/5 of vodka per day, I'm going to place a referral for intensive outpatient rehabilitation. I have advised him to follow-up with his PCP regarding further treatment of his medical issues. I would like him to do lymphedema physical therapy to help with the neurogenic edema in his right lower extremity.

## 2017-01-01 ENCOUNTER — Telehealth: Payer: Self-pay | Admitting: Sports Medicine

## 2017-01-01 ENCOUNTER — Encounter: Payer: Self-pay | Admitting: Osteopathic Medicine

## 2017-01-01 ENCOUNTER — Ambulatory Visit (INDEPENDENT_AMBULATORY_CARE_PROVIDER_SITE_OTHER): Payer: Self-pay | Admitting: Osteopathic Medicine

## 2017-01-01 VITALS — BP 122/89 | HR 107 | Temp 98.2°F | Wt 227.0 lb

## 2017-01-01 DIAGNOSIS — M79604 Pain in right leg: Secondary | ICD-10-CM

## 2017-01-01 DIAGNOSIS — T79A21S Traumatic compartment syndrome of right lower extremity, sequela: Secondary | ICD-10-CM

## 2017-01-01 DIAGNOSIS — F339 Major depressive disorder, recurrent, unspecified: Secondary | ICD-10-CM

## 2017-01-01 DIAGNOSIS — G5701 Lesion of sciatic nerve, right lower limb: Secondary | ICD-10-CM

## 2017-01-01 DIAGNOSIS — F1910 Other psychoactive substance abuse, uncomplicated: Secondary | ICD-10-CM

## 2017-01-01 MED ORDER — GABAPENTIN 600 MG PO TABS: 1200.0000 mg | ORAL_TABLET | Freq: Three times a day (TID) | ORAL | 3 refills | Status: DC

## 2017-01-01 MED ORDER — DULOXETINE HCL 60 MG PO CPEP: 120.0000 mg | ORAL_CAPSULE | Freq: Every day | ORAL | 3 refills | Status: DC

## 2017-01-01 MED ORDER — AMITRIPTYLINE HCL 75 MG PO TABS: 75.0000 mg | ORAL_TABLET | Freq: Every day | ORAL | 1 refills | Status: DC

## 2017-01-01 NOTE — Telephone Encounter (Signed)
Pt stated that his lawyer sent you a letter stating Bobby Shaw needs a letter for disability. The patient stated you agreed to write one and was wondering if that has been done and if not if there was a possibility to get it by Wed for a hearing he has. Thanks

## 2017-01-01 NOTE — Telephone Encounter (Signed)
What specifically does the letter need to say before I write it? Does it just need to say I support his full disability?

## 2017-01-01 NOTE — Patient Instructions (Addendum)
Plan: 1. Meds:  Continue Cymbalta and Gabapentin as you are currently taking them  We are ncreasing the Amitriptyline to 75 mg every night for one month - if doing well, can continue this dose.  2. Depression and drinking:   Call Dr. Lavella Lemons office to schedule a visit   AA meetings if you'd like to do that, I encourage it! Let us know if transportation is a problem for you and we can help with that   For quitting, please talk to me or Dr. Gilmore Laroche when you are ready!  3. Blood Pressure  Looking better on recheck! No need for meds

## 2017-01-01 NOTE — Progress Notes (Addendum)
HPI: Bobby Shaw is a 41 y.o. male  who presents to [facility] today, 01/01/17,  for chief complaint of:  Chief Complaint  Patient presents with  . Follow-up    History compartment syndrome, depression, alcohol abuse    History of compartment syndrome causing chronic pain, right-sided sciatic neuropathy. Appreciate assistance of Dr. Karie Schwalbe in orthopedic and pain management to this point. Patient is high risk for opiate pain medications. He states the pain is actually fairly well controlled on maximum dose of gabapentin, amitriptyline 50 which she is taking at bedtime, Cymbalta recently increased 120 mg. There is been discussion of intrathecal pain pump versus spinal cord stimulator, patient still does not have insurance, working on getting in with pain management once this is accomplished.  Substance abuse/depression: Patient states mood is not very well controlled. He is still drinking great deal of vodka on a daily basis. A referral has been put in place for behavioral health that the patient start anything back about this. We have provided him with the phone number to follow-up with behavioral health at his convenience. No thoughts of hurting self or others.   Past medical history, surgical history, social history and family history reviewed.  Patient Active Problem List   Diagnosis Date Noted  . Bipolar disorder (HCC) 10/15/2016  . Right leg swelling 08/22/2016  . Sciatic neuropathy, right 08/16/2016  . Polysubstance abuse 07/07/2016  . ANXIETY 02/13/2010  . HYPERTENSION 02/13/2010  . WRIST PAIN, LEFT 02/13/2010    Current medication list and allergy/intolerance information reviewed.   Current Outpatient Prescriptions on File Prior to Visit  Medication Sig Dispense Refill  . acetaminophen (TYLENOL) 650 MG CR tablet Take 1 tablet (650 mg total) by mouth every 8 (eight) hours as needed for pain. 90 tablet 3  . amitriptyline (ELAVIL) 50 MG tablet  TAKE ONE-HALF TABLET BY MOUTH AT BEDTIME FOR 1 WEEK, THEN 1 TABLET AT BEDTIME 30 tablet 3  . b complex vitamins capsule Take 1 capsule by mouth daily.    . cholecalciferol (VITAMIN D) 1000 units tablet Take 1,000 Units by mouth daily.    . DULoxetine (CYMBALTA) 60 MG capsule Take 2 capsules (120 mg total) by mouth daily. 60 capsule 3  . gabapentin (NEURONTIN) 600 MG tablet Take 2 tablets (1,200 mg total) by mouth 3 (three) times daily. 540 tablet 0  . HYDROcodone-acetaminophen (NORCO/VICODIN) 5-325 MG tablet Take 1-2 tablets by mouth every 4 (four) hours as needed for moderate pain or severe pain. Limit use to severe pain and lowest possible dose to avoid tolerance/dependence. 60 tablet 0  . ibuprofen (ADVIL,MOTRIN) 800 MG tablet Take 1 tablet (800 mg total) by mouth every 8 (eight) hours as needed. 90 tablet 2   No current facility-administered medications on file prior to visit.    No Known Allergies    Review of Systems:  Constitutional: No recent illness  Cardiac: No  chest pain, No  pressure, No palpitations  Respiratory:  No  shortness of breath.   Gastrointestinal: No  abdominal pain  Musculoskeletal: No new myalgia/arthralgia  Psychiatric: +concerns with depression, No  concerns with anxiety  Exam:  BP 122/89   Pulse (!) 107   Temp 98.2 F (36.8 C) (Oral)   Wt 227 lb (103 kg)   BMI 33.52 kg/m   Constitutional: VS see above. General Appearance: alert, well-developed, well-nourished, NAD  Eyes: Normal lids and conjunctive, non-icteric sclera  Respiratory: Normal respiratory effort. no wheeze, no rhonchi, no  rales  Cardiovascular: S1/S2 normal, no murmur, no rub/gallop auscultated. RRR.   Musculoskeletal: Gait antalgic favoring R leg. Symmetric and independent movement of all extremities. Skin of right foot appears a bit duskier but is warm and good capillary refill, pulses barely palpable at PT/DP  Neurological: Normal balance/coordination. No tremor.  Skin:  warm, dry, intact.   Psychiatric: Normal judgment/insight. Normal mood and affect. Oriented x3. No SI/HI. No thought disorder.     ASSESSMENT/PLAN: Eustaquio Maize now is continue current medications, get patient into behavioral health. Patient is not yet ready to quit alcohol that he is advised of risk of withdrawal symptoms and if/when he is ready to please speak with psychiatry for me about close follow-up with likely need for benzodiazepines.   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 Meds:  Continue Cymbalta and Gabapentin as you are currently taking them  We are ncreasing the Amitriptyline to 75 mg every night for one month - if doing well, can continue this dose.  2. Depression and drinking:   Call Dr. Lavella Lemons office to schedule a visit   AA meetings if you'd like to do that, I encourage it! Let us know if transportation is a problem for you and we can help with that   For quitting, please talk to me or Dr. Gilmore Laroche when you are ready!  3. Blood Pressure  Looking better on recheck! No need for meds      Follow-up plan: Return in about 6 months (around 07/03/2017).  Visit summary with medication list and pertinent instructions was printed for patient to review, alert [place] if any changes needed. All questions at time of visit were answered - patient instructed to contact office with any additional concerns. ER/RTC precautions were reviewed with the patient and understanding verbalized.   Note: Total time spent 25 minutes, greater than 50% of the visit was spent face-to-face counseling and coordinating care for the following: The primary encounter diagnosis  was Right leg pain. 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

## 2017-01-02 DIAGNOSIS — T79A21A Traumatic compartment syndrome of right lower extremity, initial encounter: Secondary | ICD-10-CM | POA: Insufficient documentation

## 2017-01-02 NOTE — Telephone Encounter (Signed)
According to the attorney they need a detailed opinion letter that describes the specific symptoms, limitations, and severity of the disability the patient has. They need this because the attorney said the medical record don't always give enough detail. Thanks

## 2017-01-09 ENCOUNTER — Encounter: Payer: Self-pay | Admitting: Sports Medicine

## 2017-01-09 NOTE — Telephone Encounter (Signed)
Letter in box. 

## 2017-01-29 ENCOUNTER — Ambulatory Visit (INDEPENDENT_AMBULATORY_CARE_PROVIDER_SITE_OTHER): Payer: Self-pay | Admitting: Sports Medicine

## 2017-01-29 ENCOUNTER — Encounter: Payer: Self-pay | Admitting: Sports Medicine

## 2017-01-29 VITALS — BP 136/78 | HR 123 | Wt 232.0 lb

## 2017-01-29 DIAGNOSIS — F1910 Other psychoactive substance abuse, uncomplicated: Secondary | ICD-10-CM

## 2017-01-29 DIAGNOSIS — F1020 Alcohol dependence, uncomplicated: Secondary | ICD-10-CM

## 2017-01-29 DIAGNOSIS — M79604 Pain in right leg: Secondary | ICD-10-CM

## 2017-01-29 DIAGNOSIS — G5701 Lesion of sciatic nerve, right lower limb: Secondary | ICD-10-CM

## 2017-01-29 DIAGNOSIS — T79A21S Traumatic compartment syndrome of right lower extremity, sequela: Secondary | ICD-10-CM

## 2017-01-29 MED ORDER — ACAMPROSATE CALCIUM 333 MG PO TBEC: 666.0000 mg | DELAYED_RELEASE_TABLET | Freq: Three times a day (TID) | ORAL | 3 refills | Status: DC

## 2017-01-29 NOTE — Progress Notes (Signed)
  Subjective:    CC: Follow-up  HPI: This is a pleasant 41 year old male, overall he is doing well and pain-free with his current dose of neuropathic agents, unfortunately he still does continue to drink excessively. He has not yet heard back from behavioral health. His amitriptyline was changed to 75 mg at bedtime, he tells me it did work better at 50 mg twice a day.  Past medical history:  Negative.  See flowsheet/record as well for more information.  Surgical history: Negative.  See flowsheet/record as well for more information.  Family history: Negative.  See flowsheet/record as well for more information.  Social history: Negative.  See flowsheet/record as well for more information.  Allergies, and medications have been entered into the medical record, reviewed, and no changes needed.   Review of Systems: No fevers, chills, night sweats, weight loss, chest pain, or shortness of breath.   Objective:    General: Well Developed, well nourished, and in no acute distress.  Neuro: Alert and oriented x3, extra-ocular muscles intact, sensation grossly intact.  HEENT: Normocephalic, atraumatic, pupils equal round reactive to light, neck supple, no masses, no lymphadenopathy, thyroid nonpalpable.  Skin: Warm and dry, no rashes. Cardiac: Regular rate and rhythm, no murmurs rubs or gallops, no lower extremity edema.  Respiratory: Clear to auscultation bilaterally. Not using accessory muscles, speaking in full sentences.  Impression and Recommendations:    Sciatic neuropathy, right Pain is now well controlled, he actually has ZERO pain on gabapentin 3600 daily, amitriptyline 50 mg twice a day (was increased to 75mg  qHS at the last visit but patient preferred 50mg  BID) and with the increase of Cymbalta to 120 mg. He only has persistent numbness in the foot which is likely going to be permanent. There was a discussion of an intrathecal pain pump versus spinal cord stimulator however I think he  needs this considering his pain is resolved with medical treatment. Return to see me on an as-needed basis and I can simply do refills from now on.  Polysubstance abuse Depression is improving, he is still drinking greater than 1/5 of vodka per day, I'm going to place a referral for intensive outpatient rehabilitation. I have advised him to follow-up with his PCP regarding further treatment of his medical issues. Adding acamprosate.  I spent 25 minutes with this patient, greater than 50% was face-to-face time counseling regarding the above diagnoses

## 2017-01-29 NOTE — Assessment & Plan Note (Signed)
Depression is improving, he is still drinking greater than 1/5 of vodka per day, I'm going to place a referral for intensive outpatient rehabilitation. I have advised him to follow-up with his PCP regarding further treatment of his medical issues. Adding acamprosate.

## 2017-01-29 NOTE — Assessment & Plan Note (Signed)
Pain is now well controlled, he actually has ZERO pain on gabapentin 3600 daily, amitriptyline 50 mg twice a day (was increased to 75mg  qHS at the last visit but patient preferred 50mg  BID) and with the increase of Cymbalta to 120 mg. He only has persistent numbness in the foot which is likely going to be permanent. There was a discussion of an intrathecal pain pump versus spinal cord stimulator however I think he needs this considering his pain is resolved with medical treatment. Return to see me on an as-needed basis and I can simply do refills from now on.

## 2017-02-12 ENCOUNTER — Other Ambulatory Visit: Payer: Self-pay

## 2017-02-14 ENCOUNTER — Other Ambulatory Visit: Payer: Self-pay

## 2017-02-14 MED ORDER — GABAPENTIN 400 MG PO CAPS: 1200.0000 mg | ORAL_CAPSULE | Freq: Three times a day (TID) | ORAL | 5 refills | Status: DC

## 2017-02-14 NOTE — Telephone Encounter (Signed)
Patient request capsules instead of tablets for the gabapentin. Please see dosing and instructions to make sure it is right. Bobby Shaw,CMA

## 2017-02-22 DIAGNOSIS — F1020 Alcohol dependence, uncomplicated: Secondary | ICD-10-CM | POA: Insufficient documentation

## 2017-02-23 DIAGNOSIS — F1990 Other psychoactive substance use, unspecified, uncomplicated: Secondary | ICD-10-CM | POA: Insufficient documentation

## 2017-02-23 DIAGNOSIS — I5032 Chronic diastolic (congestive) heart failure: Secondary | ICD-10-CM | POA: Insufficient documentation

## 2017-03-08 DIAGNOSIS — Z720 Tobacco use: Secondary | ICD-10-CM | POA: Insufficient documentation

## 2017-03-08 DIAGNOSIS — R269 Unspecified abnormalities of gait and mobility: Secondary | ICD-10-CM | POA: Insufficient documentation

## 2017-03-16 ENCOUNTER — Ambulatory Visit (INDEPENDENT_AMBULATORY_CARE_PROVIDER_SITE_OTHER): Payer: Self-pay | Admitting: Osteopathic Medicine

## 2017-03-16 ENCOUNTER — Encounter: Payer: Self-pay | Admitting: Osteopathic Medicine

## 2017-03-16 VITALS — BP 148/96 | HR 106 | Ht 69.0 in | Wt 223.1 lb

## 2017-03-16 DIAGNOSIS — F10931 Alcohol use, unspecified with withdrawal delirium: Secondary | ICD-10-CM

## 2017-03-16 DIAGNOSIS — T79A21S Traumatic compartment syndrome of right lower extremity, sequela: Secondary | ICD-10-CM

## 2017-03-16 DIAGNOSIS — F10239 Alcohol dependence with withdrawal, unspecified: Secondary | ICD-10-CM | POA: Insufficient documentation

## 2017-03-16 DIAGNOSIS — F1020 Alcohol dependence, uncomplicated: Secondary | ICD-10-CM

## 2017-03-16 DIAGNOSIS — F10231 Alcohol dependence with withdrawal delirium: Secondary | ICD-10-CM

## 2017-03-16 DIAGNOSIS — R569 Unspecified convulsions: Secondary | ICD-10-CM

## 2017-03-16 NOTE — Progress Notes (Signed)
HPI: Bobby Shaw is a [age] y.o. male  who [facility], 03/16/17,  for chief complaint of:  Chief Complaint  Patient presents with  . Hospitalization Follow-up    See hospital records for full details. Discharge summary reviewed. Patient had a 19 day hospital stay, initially presented to the ED requesting help for alcohol detox and was initially admitted to behavioral health but after concern for withdrawal symptoms was transferred to ICU where he experienced cardiac arrest, was sedated/intubated for several days. There is also some concern for aspiration pneumonia, MSSA grew out on bronchoscopy.   Discharge medications were reviewed in detail with the patient - he is doing well though still in some pain with shoulder and right leg. Has PT/OT scheduled to come to the house later today for further evaluation and treatment. He is not in need of any refills. We discussed that I was not going to be prescribing any opiate medications or other controlled substances for him, he is agreeable to this plan.  There is to full recovery at this point: Patient would like to get into an inpatient rehabilitation program for alcoholism however apparently this cannot be done given his musculoskeletal/mobility limitations. He is set up with an outpatient counseling, he is going to [employer] meetings, has a good support group in place. Currently living with mother who accompanies him to visit today    Past medical history, surgical history, social history and family history reviewed.  Patient Active Problem List   Diagnosis Date Noted  . 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 Bipolar disorder (HCC) 10/15/2016  . Right leg swelling 08/22/2016  . Sciatic neuropathy, right 08/16/2016  . Polysubstance abuse 07/07/2016  . ANXIETY 02/13/2010  . HYPERTENSION 02/13/2010  . WRIST PAIN, LEFT 02/13/2010    Current  medication list and allergy/intolerance information reviewed.   Current Outpatient Prescriptions on File Prior to Visit  Medication Sig Dispense Refill  . acetaminophen (TYLENOL) 650 MG CR tablet Take 1 tablet (650 mg total) by mouth every 8 (eight) hours as needed for pain. 90 tablet 3  . b complex vitamins capsule Take 1 capsule by mouth daily.    . cholecalciferol (VITAMIN D) 1000 units tablet Take 1,000 Units by mouth daily.    . DULoxetine (CYMBALTA) 60 MG capsule Take 2 capsules (120 mg total) by mouth daily. 180 capsule 3  . gabapentin (NEURONTIN) 400 MG capsule Take 3 capsules (1,200 mg total) by mouth 3 (three) times daily. 270 capsule 5  . ibuprofen (ADVIL,MOTRIN) 800 MG tablet Take 1 tablet (800 mg total) by mouth every 8 (eight) hours as needed. 90 tablet 2  . acamprosate (CAMPRAL) 333 MG tablet Take 2 tablets (666 mg total) by mouth 3 (three) times daily with meals. (Patient not taking: Reported on 03/16/2017) 180 tablet 3   No current facility-administered medications on file prior to visit.    No Known Allergies    Review of Systems:  Constitutional: +recent illness but feeling relatively well today   HEENT: No  headache  Cardiac: No  chest pain, No  pressure, No palpitations  Respiratory:  No  shortness of breath.   Gastrointestinal: No  abdominal pain,  Musculoskeletal: No new myalgia/arthralgia  Skin: No  Rash  Psychiatric: No  concerns with depression, +concerns with anxiety  Exam:  BP (!) 148/96   Pulse (!) 106   Ht 5\' 9"  (1.753 m)   Wt 223 lb  1.9 oz (101.2 kg)   SpO2 97%   BMI 32.95 kg/m   Constitutional: VS see above. General Appearance: alert, well-developed, well-nourished, NAD  Eyes: Normal lids and conjunctive, non-icteric sclera  Ears, Nose, Mouth, Throat: MMM, Normal external inspection ears/nares/mouth/lips/gums.  Neck: No masses, trachea midline.   Respiratory: Normal respiratory effort. no wheeze, no rhonchi, no rales  Cardiovascular:  S1/S2 normal, no murmur, no rub/gallop auscultated. RRR.   Musculoskeletal: Using rolling walker to support right leg, brace on left ankle but he is bearing weight on this joint. Limited range of motion to left shoulder. Symmetric and independent movement of all extremities  Neurological: Normal balance/coordination. No tremor.  Skin: warm, dry, intact.   Psychiatric: Normal judgment/insight. Normal mood and affect. Oriented x3.     ASSESSMENT/PLAN:   Alcoholism (HCC) - Has not consumed any alcohol since prior to hospitalization. 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 Lyn Hollingshead, and check in with Dr. Karie Schwalbe sometime next week .  Visit summary with medication list and pertinent instructions was printed for patient to review, alert [place] if any changes needed. All questions at time of visit were answered - patient instructed to contact office with any additional concerns. ER/RTC precautions were reviewed with the patient and understanding verbalized.   Total time spent 25 minutes, greater than 50% of the visit was face to face counseling and coordinating care for diagnosis of The primary encounter diagnosis was Alcoholism (HCC). 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

## 2017-03-20 ENCOUNTER — Ambulatory Visit (INDEPENDENT_AMBULATORY_CARE_PROVIDER_SITE_OTHER): Payer: Self-pay | Admitting: Sports Medicine

## 2017-03-20 ENCOUNTER — Ambulatory Visit: Payer: Medicaid Other | Admitting: Sports Medicine

## 2017-03-20 DIAGNOSIS — M7522 Bicipital tendinitis, left shoulder: Secondary | ICD-10-CM

## 2017-03-20 NOTE — Progress Notes (Signed)
   Subjective:    I'm seeing this patient as a consultation for:  Dr. Sunnie NielsenNatalie Alexander  CC: Left shoulder pain  HPI: Bobby HazardMatthew has had a fairly extensive history over the past month, he was admitted to the hospital for alcohol detox, eventually went into delirium tremens, had a vascular collapse with cardiac arrest, 4 minutes of CPR with return of spontaneous circulation, there was a prolonged ICU stay, the entire time he had some left-sided anterior shoulder pain, worse with supination of the forearm. Moderate, persistent without radiation.  Past medical history:  Negative.  See flowsheet/record as well for more information.  Surgical history: Negative.  See flowsheet/record as well for more information.  Family history: Negative.  See flowsheet/record as well for more information.  Social history: Negative.  See flowsheet/record as well for more information.  Allergies, and medications have been entered into the medical record, reviewed, and no changes needed.   Review of Systems: No headache, visual changes, nausea, vomiting, diarrhea, constipation, dizziness, abdominal pain, skin rash, fevers, chills, night sweats, weight loss, swollen lymph nodes, body aches, joint swelling, muscle aches, chest pain, shortness of breath, mood changes, visual or auditory hallucinations.   Objective:   General: Well Developed, well nourished, and in no acute distress.  Neuro/Psych: Alert and oriented x3, extra-ocular muscles intact, able to move all 4 extremities, sensation grossly intact. Skin: Warm and dry, no rashes noted.  Respiratory: Not using accessory muscles, speaking in full sentences, trachea midline.  Cardiovascular: Pulses palpable, no extremity edema. Abdomen: Does not appear distended. Left Shoulder: Inspection reveals no abnormalities, atrophy or asymmetry. Palpation is normal with no tenderness over AC joint or bicipital groove. ROM is full in all planes. Rotator cuff strength normal  throughout. No signs of impingement with negative Neer and Hawkin's tests, empty can. Speeds and Yergason's tests strongly positive. No labral pathology noted with negative Obrien's, negative crank, negative clunk, and good stability. Normal scapular function observed. No painful arc and no drop arm sign. No apprehension sign  Procedure: Real-time Ultrasound Guided Injection of left biceps tendon sheath Device: GE Logiq E  Verbal informed consent obtained.  Time-out conducted.  Noted no overlying erythema, induration, or other signs of local infection.  Skin prepped in a sterile fashion.  Local anesthesia: Topical Ethyl chloride.  With sterile technique and under real time ultrasound guidance: 25-gauge needle advanced into the bicipital groove under the biceps tendon long head, I then injected 1 mL Kenalog 40, 1 mL lidocaine, 1 mL bupivacaine taking care to avoid intratendinous injection. Completed without difficulty  Pain immediately resolved suggesting accurate placement of the medication.  Advised to call if fevers/chills, erythema, induration, drainage, or persistent bleeding.  Images permanently stored and available for review in the ultrasound unit.  Impression: Technically successful ultrasound guided injection.  Impression and Recommendations:   This case required medical decision making of moderate complexity.  Biceps tendinitis of left shoulder Per patient request, biceps tendon sheath injection as above. Home rehabilitation exercises given, return in one month.

## 2017-03-20 NOTE — Assessment & Plan Note (Signed)
Per patient request, biceps tendon sheath injection as above. Home rehabilitation exercises given, return in one month.

## 2017-03-21 ENCOUNTER — Telehealth: Payer: Self-pay | Admitting: *Deleted

## 2017-03-21 NOTE — Telephone Encounter (Signed)
Pt called and wanted to know if it would be ok for him to increase his dose of Gabapentin 400 mg TID (1,200) to 500 mg TID (1,500) so that he can avoid some of the breakthrough pain that he is experiencing. He reports that he forgot to mention/ask this at his last OV. Will fwd to pcp for advice.Loralee PacasBarkley, Aydyn Testerman HarriettaLynetta

## 2017-03-21 NOTE — Telephone Encounter (Signed)
He can increase it to 800 mg 3 times a day.  Maximum dose is 1200 mg 3 times a day - let me know if he needs refill

## 2017-03-22 ENCOUNTER — Ambulatory Visit: Payer: Medicaid Other | Admitting: Sports Medicine

## 2017-03-23 NOTE — Telephone Encounter (Signed)
Mother of pt notified.

## 2017-03-29 ENCOUNTER — Telehealth: Payer: Self-pay

## 2017-03-29 NOTE — Telephone Encounter (Signed)
Pt states he is still having nerve pain in his foot and it is not going away. Pt states he is currently doing physical therapy. Pt also stated he is worried about getting his oxycodone due to pain clinic being closed. Pt stated he was given 16 pills from hospital but only currently has 8 left. Pt wants to know what he can do or if there is another pain clinic he can go to? Please Advise?

## 2017-03-29 NOTE — Telephone Encounter (Signed)
He can research other pain clinics in the area - we have a list of ones in [place], [place], [address] [place] and [place] - does he have a preference?

## 2017-03-29 NOTE — Telephone Encounter (Signed)
Left message on vm to let us know what location his preference is.

## 2017-04-03 NOTE — Telephone Encounter (Signed)
Pt called, since increased gabapentin to 800mg  daily- his Rx is running out early. Requesting new Rx to reflect increase in dosage.

## 2017-04-03 NOTE — Telephone Encounter (Signed)
I ordered Rx for max dose with multiple refills and sent this to pharmacy on 02/14/17 - can we make sure he knows there are refills available? THanks.

## 2017-04-04 ENCOUNTER — Telehealth: Payer: Self-pay | Admitting: *Deleted

## 2017-04-04 NOTE — Telephone Encounter (Signed)
Spoke with Pt, they have not gotten new Rx. Will go get today.

## 2017-04-04 NOTE — Telephone Encounter (Signed)
Aurther Lofterry, physical therapist from [facility], left vm this morning stating that the last four bp readings on the pt have been elevated.  They were 142/110, 142/104, 140/98, and 150/102.  Aurther Lofterry stated that this is the second message he's left but I don't see anything in pt's chart.  Please advise.

## 2017-04-05 MED ORDER — LOSARTAN POTASSIUM 25 MG PO TABS: 25.0000 mg | ORAL_TABLET | Freq: Every day | ORAL | 1 refills | Status: DC

## 2017-04-05 NOTE — Telephone Encounter (Addendum)
The patient and I have spoken about possibly starting a blood pressure medication before. If his blood pressures at home are running on the high side, I think reasonable to go ahead and send in a medication for him if he would be willing to take it. - Can call and asked patient if labs fine or if she would like to discuss further in the office. Either way, if we decide to start a blood pressure medication, I would want to see him back in the office in about 2 weeks to see how he's doing on the new medicine

## 2017-04-05 NOTE — Addendum Note (Signed)
Addended by: Deirdre PippinsALEXANDER, Raigen Jagielski M on: 04/05/2017 08:53 AM   Modules accepted: Orders

## 2017-04-05 NOTE — Telephone Encounter (Signed)
Pt is ok with starting bp medicine.  He has an appt with you already on the 23rd.

## 2017-04-05 NOTE — Telephone Encounter (Signed)
Since low-dose blood pressure medication into pharmacy on file, [facility]. If blood pressure at goal at next visit, can continue. If not, we can increase dose. We'll discuss at follow-up in detail

## 2017-04-06 ENCOUNTER — Other Ambulatory Visit: Payer: Self-pay | Admitting: *Deleted

## 2017-04-06 MED ORDER — LOSARTAN POTASSIUM 25 MG PO TABS: 25.0000 mg | ORAL_TABLET | Freq: Every day | ORAL | 1 refills | Status: DC

## 2017-04-16 ENCOUNTER — Encounter: Payer: Self-pay | Admitting: Osteopathic Medicine

## 2017-04-16 ENCOUNTER — Ambulatory Visit (INDEPENDENT_AMBULATORY_CARE_PROVIDER_SITE_OTHER): Payer: Medicaid Other | Admitting: Sports Medicine

## 2017-04-16 ENCOUNTER — Ambulatory Visit (INDEPENDENT_AMBULATORY_CARE_PROVIDER_SITE_OTHER): Payer: Medicaid Other | Admitting: Osteopathic Medicine

## 2017-04-16 ENCOUNTER — Encounter: Payer: Self-pay | Admitting: Sports Medicine

## 2017-04-16 VITALS — BP 134/77 | HR 94 | Ht 69.0 in | Wt 217.0 lb

## 2017-04-16 DIAGNOSIS — T79A21S Traumatic compartment syndrome of right lower extremity, sequela: Secondary | ICD-10-CM

## 2017-04-16 DIAGNOSIS — I10 Essential (primary) hypertension: Secondary | ICD-10-CM | POA: Diagnosis not present

## 2017-04-16 DIAGNOSIS — M7522 Bicipital tendinitis, left shoulder: Secondary | ICD-10-CM

## 2017-04-16 MED ORDER — LOSARTAN POTASSIUM 25 MG PO TABS: 25.0000 mg | ORAL_TABLET | Freq: Every day | ORAL | 1 refills | Status: DC

## 2017-04-16 NOTE — Assessment & Plan Note (Signed)
Symptoms resolved after bicipital sheath injection, patient will improve his compliance with rehabilitation exercises, return as needed.

## 2017-04-16 NOTE — Progress Notes (Signed)
HPI: Bobby Shaw is a 41 y.o. male  who [facility] [place] today, 04/16/17,  for chief complaint of:  Chief Complaint  Patient presents with  . Hypertension    Following up today on hypertension. Blood pressure is better on current medication. No chest pain, dizziness, shortness of breath.  Chronic pain due to consultations from compartment syndrome, stable on current medications, referral is in place to pain management. Patient has some questions about dosing of gabapentin which were clarified, questions on whether he should continue the Topamax.  Patient is accompanied by Bobby Shaw who assists with history-taking.   Past medical history, surgical history, social history and family history reviewed.  Patient Active Problem List   Diagnosis Date Noted  . Biceps tendinitis of left shoulder 03/20/2017  . Seizure due to alcohol withdrawal (HCC) 03/16/2017  . Gait difficulty 03/08/2017  . Tobacco abuse 03/08/2017  . Chronic diastolic congestive heart failure (HCC) 02/23/2017  . Drug use 02/23/2017  . Alcohol use disorder, severe, dependence (HCC) 02/22/2017  . 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 Bipolar disorder (HCC) 10/15/2016  . Right leg swelling 08/22/2016  . Sciatic neuropathy, right 08/16/2016  . Polysubstance abuse 07/07/2016  . ANXIETY 02/13/2010  . HYPERTENSION 02/13/2010  . WRIST PAIN, LEFT 02/13/2010    Current medication list and allergy/intolerance information reviewed.   Current Outpatient Prescriptions on File Prior to Visit  Medication Sig Dispense Refill  . acamprosate (CAMPRAL) 333 MG tablet Take 2 tablets (666 mg total) by mouth 3 (three) times daily with meals. 180 tablet 3  . acetaminophen (TYLENOL) 650 MG CR tablet Take 1 tablet (650 mg total) by mouth every 8 (eight) hours as needed for pain. 90 tablet 3  . amitriptyline (ELAVIL) 75 MG tablet Take 1 tablet by mouth daily.    Marland Kitchen. b  complex vitamins capsule Take 1 capsule by mouth daily.    . cholecalciferol (VITAMIN D) 1000 units tablet Take 1,000 Units by mouth daily.    . DULoxetine (CYMBALTA) 60 MG capsule Take 2 capsules (120 mg total) by mouth daily. 180 capsule 3  . gabapentin (NEURONTIN) 400 MG capsule Take 3 capsules (1,200 mg total) by mouth 3 (three) times daily. 270 capsule 5  . ibuprofen (ADVIL,MOTRIN) 800 MG tablet Take 1 tablet (800 mg total) by mouth every 8 (eight) hours as needed. 90 tablet 2  . losartan (COZAAR) 25 MG tablet Take 1 tablet (25 mg total) by mouth daily. 30 tablet 1  . topiramate (TOPAMAX) 50 MG tablet Take 1 tablet by mouth daily.     No current facility-administered medications on file prior to visit.    No Known Allergies    Review of Systems:  Constitutional: No recent illness, feeling fairly well today  HEENT: No  headache, no vision change  Cardiac: No  chest pain, No  pressure, No palpitations  Respiratory:  No  shortness of breath. No  Cough  Gastrointestinal: No  abdominal pain, no change on bowel habits  Musculoskeletal: No new myalgia/arthralgia  Psychiatric: No  concerns with depression, No  concerns with anxiety  Exam:  BP 134/77   Pulse 94   Ht 5\' 9"  (1.753 m)   Wt 217 lb (98.4 kg)   BMI 32.05 kg/m   Constitutional: VS see above. General Appearance: alert, well-developed, well-nourished, NAD  Eyes: Normal lids and conjunctive, non-icteric sclera  Ears, Nose, Mouth, Throat: MMM, Normal external inspection ears/nares/mouth/lips/gums.  Neck: No  masses, trachea midline.   Respiratory: Normal respiratory effort. no wheeze, no rhonchi, no rales  Cardiovascular: S1/S2 normal, no murmur, no rub/gallop auscultated. RRR.   Musculoskeletal: Gait normal. Symmetric and independent movement of all extremities  Neurological: Normal balance/coordination. No tremor.  Skin: warm, dry, intact.   Psychiatric: Normal judgment/insight. Normal mood and affect.  Oriented x3.       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  Visit summary with medication list and pertinent instructions was printed for patient to review, alert [place] if any changes needed. All questions at time of visit were answered - patient instructed to contact office with any additional concerns. ER/RTC precautions were reviewed with the patient and understanding verbalized.   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 A diagnosis of Essential hypertension was also pertinent to this visit.Marland Kitchen

## 2017-04-16 NOTE — Progress Notes (Signed)
  Subjective:    CC: Follow-up  HPI: Left shoulder pain: Bicipital tendinitis, biceps sheath injection provided complete relief, not doing a whole of the exercises but will increase diligence.  Past medical history:  Negative.  See flowsheet/record as well for more information.  Surgical history: Negative.  See flowsheet/record as well for more information.  Family history: Negative.  See flowsheet/record as well for more information.  Social history: Negative.  See flowsheet/record as well for more information.  Allergies, and medications have been entered into the medical record, reviewed, and no changes needed.   Review of Systems: No fevers, chills, night sweats, weight loss, chest pain, or shortness of breath.   Objective:    General: Well Developed, well nourished, and in no acute distress.  Neuro: Alert and oriented x3, extra-ocular muscles intact, sensation grossly intact.  HEENT: Normocephalic, atraumatic, pupils equal round reactive to light, neck supple, no masses, no lymphadenopathy, thyroid nonpalpable.  Skin: Warm and dry, no rashes. Cardiac: Regular rate and rhythm, no murmurs rubs or gallops, no lower extremity edema.  Respiratory: Clear to auscultation bilaterally. Not using accessory muscles, speaking in full sentences. Left Shoulder: Inspection reveals no abnormalities, atrophy or asymmetry. Palpation is normal with no tenderness over AC joint or bicipital groove. ROM is full in all planes. Rotator cuff strength normal throughout. No signs of impingement with negative Neer and Hawkin's tests, empty can. Speeds and Yergason's tests normal. No labral pathology noted with negative Obrien's, negative crank, negative clunk, and good stability. Normal scapular function observed. No painful arc and no drop arm sign. No apprehension sign  Impression and Recommendations:    Biceps tendinitis of left shoulder Symptoms resolved after bicipital sheath injection, patient  will improve his compliance with rehabilitation exercises, return as needed.

## 2017-04-17 LAB — COMPLETE METABOLIC PANEL WITH GFR
ALT: 29 U/L (ref 9–46)
AST: 21 U/L (ref 10–40)
Albumin: 4.5 g/dL (ref 3.6–5.1)
Alkaline Phosphatase: 86 U/L (ref 40–115)
BILIRUBIN TOTAL: 0.4 mg/dL (ref 0.2–1.2)
BUN: 7 mg/dL (ref 7–25)
CHLORIDE: 104 mmol/L (ref 98–110)
CO2: 26 mmol/L (ref 20–31)
Calcium: 9.5 mg/dL (ref 8.6–10.3)
Creat: 0.92 mg/dL (ref 0.60–1.35)
Glucose, Bld: 112 mg/dL — ABNORMAL HIGH (ref 65–99)
POTASSIUM: 4.5 mmol/L (ref 3.5–5.3)
Sodium: 140 mmol/L (ref 135–146)
TOTAL PROTEIN: 7.1 g/dL (ref 6.1–8.1)

## 2017-04-20 ENCOUNTER — Other Ambulatory Visit: Payer: Self-pay | Admitting: *Deleted

## 2017-05-22 ENCOUNTER — Encounter: Payer: Self-pay | Admitting: Osteopathic Medicine

## 2017-05-22 ENCOUNTER — Ambulatory Visit (INDEPENDENT_AMBULATORY_CARE_PROVIDER_SITE_OTHER): Payer: Medicaid Other | Admitting: Osteopathic Medicine

## 2017-05-22 ENCOUNTER — Ambulatory Visit (HOSPITAL_BASED_OUTPATIENT_CLINIC_OR_DEPARTMENT_OTHER)
Admission: RE | Admit: 2017-05-22 | Discharge: 2017-05-22 | Disposition: A | Discharge status: Home / Self Care | Payer: Medicaid Other | Source: Ambulatory Visit | Attending: Osteopathic Medicine | Admitting: Osteopathic Medicine

## 2017-05-22 ENCOUNTER — Ambulatory Visit (INDEPENDENT_AMBULATORY_CARE_PROVIDER_SITE_OTHER): Payer: Medicaid Other

## 2017-05-22 ENCOUNTER — Encounter (HOSPITAL_BASED_OUTPATIENT_CLINIC_OR_DEPARTMENT_OTHER): Payer: Self-pay

## 2017-05-22 VITALS — BP 96/59 | HR 108 | Temp 98.2°F | Ht 69.0 in | Wt 221.0 lb

## 2017-05-22 DIAGNOSIS — R938 Abnormal findings on diagnostic imaging of other specified body structures: Secondary | ICD-10-CM | POA: Insufficient documentation

## 2017-05-22 DIAGNOSIS — R05 Cough: Secondary | ICD-10-CM | POA: Diagnosis not present

## 2017-05-22 DIAGNOSIS — J986 Disorders of diaphragm: Secondary | ICD-10-CM | POA: Diagnosis not present

## 2017-05-22 DIAGNOSIS — J40 Bronchitis, not specified as acute or chronic: Secondary | ICD-10-CM | POA: Diagnosis not present

## 2017-05-22 DIAGNOSIS — I281 Aneurysm of pulmonary artery: Secondary | ICD-10-CM | POA: Diagnosis not present

## 2017-05-22 DIAGNOSIS — R9389 Abnormal findings on diagnostic imaging of other specified body structures: Secondary | ICD-10-CM

## 2017-05-22 DIAGNOSIS — R911 Solitary pulmonary nodule: Secondary | ICD-10-CM

## 2017-05-22 DIAGNOSIS — R059 Cough, unspecified: Secondary | ICD-10-CM

## 2017-05-22 DIAGNOSIS — I2510 Atherosclerotic heart disease of native coronary artery without angina pectoris: Secondary | ICD-10-CM | POA: Diagnosis not present

## 2017-05-22 DIAGNOSIS — R918 Other nonspecific abnormal finding of lung field: Secondary | ICD-10-CM | POA: Insufficient documentation

## 2017-05-22 LAB — CBC WITH DIFFERENTIAL/PLATELET
BASOS ABS: 0 {cells}/uL (ref 0–200)
Basophils Relative: 0 %
EOS PCT: 0 %
Eosinophils Absolute: 0 cells/uL — ABNORMAL LOW (ref 15–500)
HCT: 34.1 % — ABNORMAL LOW (ref 38.5–50.0)
HEMOGLOBIN: 10.5 g/dL — AB (ref 13.2–17.1)
LYMPHS ABS: 2210 {cells}/uL (ref 850–3900)
Lymphocytes Relative: 26 %
MCH: 24.1 pg — ABNORMAL LOW (ref 27.0–33.0)
MCHC: 30.8 g/dL — ABNORMAL LOW (ref 32.0–36.0)
MCV: 78.2 fL — ABNORMAL LOW (ref 80.0–100.0)
MPV: 9.9 fL (ref 7.5–12.5)
Monocytes Absolute: 850 cells/uL (ref 200–950)
Monocytes Relative: 10 %
NEUTROS ABS: 5440 {cells}/uL (ref 1500–7800)
Neutrophils Relative %: 64 %
Platelets: 246 10*3/uL (ref 140–400)
RBC: 4.36 MIL/uL (ref 4.20–5.80)
RDW: 15.5 % — ABNORMAL HIGH (ref 11.0–15.0)
WBC: 8.5 10*3/uL (ref 3.8–10.8)

## 2017-05-22 MED ORDER — AZITHROMYCIN 250 MG PO TABS: ORAL_TABLET | ORAL | 0 refills | Status: DC

## 2017-05-22 MED ORDER — ALBUTEROL SULFATE HFA 108 (90 BASE) MCG/ACT IN AERS: 2.0000 | INHALATION_SPRAY | Freq: Four times a day (QID) | RESPIRATORY_TRACT | 1 refills | Status: DC | PRN

## 2017-05-22 MED ORDER — IOPAMIDOL (ISOVUE-300) INJECTION 61%
100.0000 mL | Freq: Once | INTRAVENOUS | Status: AC | PRN
  Administered 2017-05-22: 80 mL via INTRAVENOUS

## 2017-05-22 MED ORDER — BENZONATATE 200 MG PO CAPS: 200.0000 mg | ORAL_CAPSULE | Freq: Three times a day (TID) | ORAL | 1 refills | Status: DC | PRN

## 2017-05-22 NOTE — Progress Notes (Addendum)
HPI: Bobby Shaw is a 41 y.o. male  who presents to [facility] today, 05/22/17,  for chief complaint of:  Chief Complaint  Patient presents with  . Cough    Coughing . Context: Patient's mom was recently sick and he seems to cut seen thing. . Location/Quality: chest congestion, coughing and hoarseness . Duration: 2-3 days . Assoc signs/symptoms: "hallucinating." He reports feeling quite ill, thinks he is seeing people in his room that aren't there. He is pretty self-aware of these hallucinations, he does have a history of drug/alcohol abuse but he has been clean.    Past medical, surgical, social and family history reviewed: Patient Active Problem List   Diagnosis Date Noted  . Biceps tendinitis of left shoulder 03/20/2017  . Seizure due to alcohol withdrawal (HCC) 03/16/2017  . Gait difficulty 03/08/2017  . Tobacco abuse 03/08/2017  . Chronic diastolic congestive heart failure (HCC) 02/23/2017  . Drug use 02/23/2017  . Alcohol use disorder, severe, dependence (HCC) 02/22/2017  . 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 Bipolar disorder (HCC) 10/15/2016  . Right leg swelling 08/22/2016  . Sciatic neuropathy, right 08/16/2016  . Polysubstance abuse 07/07/2016  . ANXIETY 02/13/2010  . HYPERTENSION 02/13/2010  . WRIST PAIN, LEFT 02/13/2010   Past Surgical History:  Procedure Laterality Date  . FOOT SURGERY     Social History  Substance Use Topics  . Smoking status: Current Every Day Smoker    Packs/day: 1.00    Years: 20.00    Types: Cigarettes  . Smokeless tobacco: Never Used  . Alcohol use Yes     Comment: per chart, former alcohol abuse   Family History  Problem Relation Age of Onset  . Hypertension Father      Current medication list and allergy/intolerance information reviewed:   Current Outpatient Prescriptions  Medication Sig Dispense Refill  . acamprosate (CAMPRAL) 333 MG tablet  Take 2 tablets (666 mg total) by mouth 3 (three) times daily with meals. 180 tablet 3  . acetaminophen (TYLENOL) 650 MG CR tablet Take 1 tablet (650 mg total) by mouth every 8 (eight) hours as needed for pain. 90 tablet 3  . amitriptyline (ELAVIL) 75 MG tablet Take 1 tablet by mouth daily.    Marland Kitchen b complex vitamins capsule Take 1 capsule by mouth daily.    . cholecalciferol (VITAMIN D) 1000 units tablet Take 1,000 Units by mouth daily.    . DULoxetine (CYMBALTA) 60 MG capsule Take 2 capsules (120 mg total) by mouth daily. 180 capsule 3  . gabapentin (NEURONTIN) 400 MG capsule Take 3 capsules (1,200 mg total) by mouth 3 (three) times daily. 270 capsule 5  . ibuprofen (ADVIL,MOTRIN) 800 MG tablet Take 1 tablet (800 mg total) by mouth every 8 (eight) hours as needed. 90 tablet 2  . losartan (COZAAR) 25 MG tablet Take 1 tablet (25 mg total) by mouth daily. 90 tablet 1  . topiramate (TOPAMAX) 50 MG tablet Take 1 tablet by mouth daily.     No current facility-administered medications for this visit.    No Known Allergies    Review of Systems:  Constitutional:  No  fever, +chills, +recent illness, No unintentional weight changes. +significant fatigue.   HEENT: +sinus headache, no vision change, no hearing change, No sore throat  Cardiac: No  chest pain, No  pressure, No palpitations, No  Orthopnea  Respiratory:  No  shortness of breath. +Cough  Gastrointestinal: No  abdominal  pain, No  nausea, No  vomiting, No  diarrhea  Musculoskeletal: No new myalgia/arthralgia  Skin: No  Rash  Neurologic: No  weakness, No  dizziness   Exam:  BP (!) 96/59   Pulse (!) 108   Ht 5\' 9"  (1.753 m)   Wt 221 lb (100.2 kg)   BMI 32.64 kg/m   Constitutional: VS see above. General Appearance: alert, well-developed, well-nourished, NAD  Eyes: Normal lids and conjunctive, non-icteric sclera  Ears, Nose, Mouth, Throat: MMM, Normal external inspection ears/nares/mouth/lips/gums. TM normal bilaterally.  Pharynx/tonsils no erythema, no exudate. Nasal mucosa normal.   Neck: No masses, trachea midline. No thyroid enlargement. No tenderness/mass appreciated. No lymphadenopathy  Respiratory: Normal respiratory effort. no wheeze, no rhonchi, no rales  Cardiovascular: S1/S2 normal, no murmur, no rub/gallop auscultated. RRR. No lower extremity edema. Pedal pulse II/IV bilaterally DP and PT. No carotid bruit or JVD. No abdominal aortic bruit.  Gastrointestinal: Nontender, no masses. No hepatomegaly, no splenomegaly. No hernia appreciated. Bowel sounds normal. Rectal exam deferred.   Musculoskeletal: Gait normal. No clubbing/cyanosis of digits.   Neurological: Normal balance/coordination. No tremor. No cranial nerve deficit on limited exam. Motor and sensation intact and symmetric. Cerebellar reflexes intact.   Skin: warm, dry, intact. No rash/ulcer. No concerning nevi or subq nodules on limited exam.    Psychiatric: Normal judgment/insight. Normal mood and affect. Oriented to person (himself and me), place, day, month/year, time, not to date but close (thinks 23rd, is 28th).    No results found for this or any previous visit (from the past 72 hour(s)).  CXR on personal review Cardiomediastinal silhouette/heart size: enlarged compared to previous Obvious bony abnormality: none Infiltrate: none I can appreciate Mass or other opacity: questionable - best seen on lateral view ?under R hemidiaphragm or heart? Atelectasis: none Diaphragms: abnormal - R elevated Lateral view: abnormal - see above Images were reviewed with the patient. Pt counseled that radiologist will review the images as well, our office will call if the formal read reveals any significant findings other than what has been noted above.     ASSESSMENT/PLAN: At this pont, based on mung exam there is certainly bronchitis, but CXR, unless I'm totally off, shows some changes with new hemidiaphragm and possible intrathoracic mass  somewhat obscured by heart/hemidiaphragm. I reviewed reports of CXR from his hospitalization at [facility], there is no mention of hemidiaphragm. Given concern for changes/mass, I think we can go ahead and get CT for further evaluation unless CXR read is more revealing w/ radiology interpretation.   Cough - Plan: CBC with Differential/Platelet, DG Chest 2 View, CT Chest W Contrast  Elevated hemidiaphragm - Plan: CT Chest W Contrast  Abnormal chest x-ray - Plan: CT Chest W Contrast  Bronchitis - Plan: benzonatate (TESSALON) 200 MG capsule, azithromycin (ZITHROMAX) 250 MG tablet, albuterol (PROVENTIL HFA;VENTOLIN HFA) 108 (90 Base) MCG/ACT inhaler    Patient Instructions  CT Chest will be at [facility], please arrive there at 2:45, see printed directions Medications are at your pharmacy    Dg Chest 2 View  Result Date: 05/22/2017 CLINICAL DATA:  Cough, shortness of breath for 3 days, smoking history EXAM: CHEST  2 VIEW COMPARISON:  Chest x-ray of 05/07/2013 FINDINGS: No active infiltrate or effusion is seen. Mediastinal and hilar contours are unchanged and the heart is within upper limits of normal. No bony abnormality is seen. IMPRESSION: No active infiltrate or effusion.  Borderline cardiomegaly. Electronically Signed   By: Lucienne Minks.D.  On: 05/22/2017 14:39   Ct Chest W Contrast  Result Date: 05/22/2017 CLINICAL DATA:  New onset cough. EXAM: CT CHEST WITH CONTRAST TECHNIQUE: Multidetector CT imaging of the chest was performed during intravenous contrast administration. CONTRAST:  62mL ISOVUE-300 IOPAMIDOL (ISOVUE-300) INJECTION 61% COMPARISON:  05/22/2017 CXR FINDINGS: Cardiovascular: Dilatation of main pulmonary artery to 4 cm consistent with component of chronic pulmonary hypertension. Borderline cardiomegaly normal branch pattern of the great vessels. No pericardial effusion. Three-vessel coronary arteriosclerosis is noted. No aortic aneurysm nor dissection. No acute central  pulmonary embolus though the study is not tailored toward the pulmonary arteries. Mediastinum/Nodes: Mild prominence of the thyroid gland which extends to the level of the clavicular heads. No focal mass however is seen. The trachea mainstem bronchi are patent. Lungs/Pleura: Faint multilobar ground-glass infiltrates are seen bilaterally with semi solid 6.3 mm in average ground-glass nodule in the right upper lobe posteriorly, image 81/ series 3. Additional semi solid ground-glass nodule in the left lower lobe measuring 5.5 mm in average, image 112/3. Adjacent calcified nodule consistent with a small granuloma is noted. Trace bilateral pleural effusions. Upper Abdomen: Hepatic steatosis. Musculoskeletal: No chest wall abnormality. No acute or significant osseous findings. IMPRESSION: 1. Nonspecific multilobar faint ground-glass opacities may be secondary to an atypical pneumonia, nonspecific interstitial pneumonitis, bronchiolitis, or pulmonary edema among some considerations though not exclusive. Trace small pleural effusions are noted. 2. 6.3 mm in average part solid ground-glass nodule in the right upper lobe adjacent to a calcified granuloma. Non-contrast chest CT at 3-6 months is recommended. Additional semi solid ground-glass nodule in the left lower lobe measuring 5.5 mm in average, image 112/3. If nodules persist, subsequent management will be based upon the most suspicious nodule(s). This recommendation follows the consensus statement: Guidelines for Management of Incidental Pulmonary Nodules Detected on CT Images: From the Fleischner Society 2017; Radiology 2017; 284:228-243. 3. Dilatation of the main pulmonary artery to 4 cm, suggestive of a component of chronic pulmonary hypertension. 4. Three-vessel coronary arteriosclerosis. Electronically Signed   By: Tollie Eth M.D.   On: 05/22/2017 15:24   Called patient's mom and relayed results, all questions answered.   Visit summary with medication list and  pertinent instructions was printed for patient to review. All questions at time of visit were answered - patient instructed to contact office with any additional concerns. ER/RTC precautions were reviewed with the patient. Follow-up plan: Return in about 2 days (around 05/24/2017) for recheck breathing and review CT results .  Note: Total time spent 40 minutes, greater than 50% of the visit was spent face-to-face counseling and coordinating care for the following: The primary encounter diagnosis was Cough. Diagnoses of Elevated hemidiaphragm and Abnormal chest x-ray were also pertinent to this visit.Marland Kitchen

## 2017-05-22 NOTE — Patient Instructions (Signed)
CT Chest will be at [facility], please arrive there at 2:45, see printed directions Medications are at your pharmacy

## 2017-05-24 ENCOUNTER — Ambulatory Visit: Payer: Medicaid Other | Admitting: Osteopathic Medicine

## 2017-06-27 ENCOUNTER — Telehealth: Payer: Self-pay | Admitting: Sports Medicine

## 2017-06-27 DIAGNOSIS — G5701 Lesion of sciatic nerve, right lower limb: Secondary | ICD-10-CM

## 2017-06-27 NOTE — Telephone Encounter (Signed)
Pt mother came in asked if they can get your opinion on a lumbar epidural for Bobby Shaw. His other doctor wants to give one to him for his pain but they want to know if it is worth it and if it is a good idea since his pain is caused by the compartment syndrome. Thanks

## 2017-06-28 NOTE — Telephone Encounter (Signed)
Pt mother said there was no lumbar mri done. They were just wanting to give him a lumbar epideral for his pain. They were just wanting a second opinion if it was a good idea to get one since he does have the compartment syndrome. Thanks

## 2017-06-28 NOTE — Telephone Encounter (Signed)
I think an epidural would be reasonable if we find that there is a degenerative process in his lumbar spine, such as a bulging disc or arthritic facet joint. If a lumbar spine MRI is normal then I don't think a lumbar epidural would be indicated.   Happy to order the MRI.

## 2017-06-28 NOTE — Telephone Encounter (Signed)
What did the lumbar spine MRI show and at what level of a planning to do the epidural?  These details all come into play with making a decision to proceed to an interventional procedure.

## 2017-06-29 NOTE — Telephone Encounter (Signed)
MRI ordered

## 2017-06-29 NOTE — Telephone Encounter (Signed)
Return after MRI to go over results

## 2017-06-29 NOTE — Telephone Encounter (Signed)
Pt's mother called back and would like to have Mri ordered. Thanks

## 2017-06-29 NOTE — Addendum Note (Signed)
Addended by: Monica Becton on: 06/29/2017 11:13 AM   Modules accepted: Orders

## 2017-07-09 ENCOUNTER — Ambulatory Visit: Payer: Medicaid Other

## 2017-07-09 ENCOUNTER — Telehealth: Payer: Self-pay | Admitting: Sports Medicine

## 2017-07-09 NOTE — Telephone Encounter (Signed)
Pt was set to have MRI today, but once he arrived he was unsure if he has had a stent placed. MRI on hold for now.

## 2017-07-30 ENCOUNTER — Telehealth: Payer: Self-pay | Admitting: Osteopathic Medicine

## 2017-07-30 NOTE — Telephone Encounter (Signed)
Pt can not have MRI of back due to metal in his neck. Do you want to do a CT scan or please call pt with next steps

## 2017-07-30 NOTE — Telephone Encounter (Signed)
What metal does he have in his neck?  Orthopedic and neurosurgical hardware is typically MRI safe.
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
## 2017-08-01 NOTE — Telephone Encounter (Signed)
Bobby Shaw called imaging to verify with them that it was not MRI safe metal so I will route this to her and Dr.T so she can tell you exactly what was said

## 2017-08-02 NOTE — Telephone Encounter (Signed)
Authorization window has expired. Will resubmit.

## 2017-08-02 NOTE — Telephone Encounter (Signed)
Discussed findings with Dr. 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  These are designed to be MRI safe, and 25 years after implantation it probably scarred down well per radiology.  The plan is to proceed with MRI, lumbar spine MRI would place the isocenter of the magnetic field well away from his shoulder, and if he feels any burning sensation or heating up in the MRI can be stopped.  I have advised the patient to go ahead and call radiology for scheduling.  His pain doctor is going to be performing his epidural but I do think we need an MRI for further discussion and evaluation if the epidural fails.

## 2017-08-02 NOTE — Telephone Encounter (Signed)
Authorization has been extended, imaging advised to contact Pt.

## 2017-08-13 ENCOUNTER — Telehealth: Payer: Self-pay | Admitting: Sports Medicine

## 2017-08-13 ENCOUNTER — Ambulatory Visit (INDEPENDENT_AMBULATORY_CARE_PROVIDER_SITE_OTHER): Payer: Medicaid Other

## 2017-08-13 DIAGNOSIS — M5116 Intervertebral disc disorders with radiculopathy, lumbar region: Secondary | ICD-10-CM

## 2017-08-13 DIAGNOSIS — M48061 Spinal stenosis, lumbar region without neurogenic claudication: Secondary | ICD-10-CM | POA: Insufficient documentation

## 2017-08-13 NOTE — Telephone Encounter (Signed)
MRI shows severe spinal stenosis at multiple levels but worst at L3-L4, we are going to proceed with a right L3-L4 interlaminar epidural, I do suspect he will need a two-level decompressive laminectomy.  Return to see me 1 month after injection.

## 2017-08-13 NOTE — Assessment & Plan Note (Signed)
MRI shows severe spinal stenosis at multiple levels but worst at L3-L4, we are going to proceed with a right L3-L4 interlaminar epidural, I do suspect he will need a two-level decompressive laminectomy.  Return to see me 1 month after injection. 

## 2017-08-20 ENCOUNTER — Ambulatory Visit: Payer: Medicaid Other | Admitting: Osteopathic Medicine

## 2017-08-21 ENCOUNTER — Ambulatory Visit (INDEPENDENT_AMBULATORY_CARE_PROVIDER_SITE_OTHER): Payer: Medicaid Other | Admitting: Osteopathic Medicine

## 2017-08-21 ENCOUNTER — Encounter: Payer: Self-pay | Admitting: Osteopathic Medicine

## 2017-08-21 VITALS — BP 140/85 | HR 111 | Temp 98.1°F | Resp 18 | Wt 239.7 lb

## 2017-08-21 DIAGNOSIS — I2510 Atherosclerotic heart disease of native coronary artery without angina pectoris: Secondary | ICD-10-CM

## 2017-08-21 DIAGNOSIS — I10 Essential (primary) hypertension: Secondary | ICD-10-CM

## 2017-08-21 DIAGNOSIS — I2583 Coronary atherosclerosis due to lipid rich plaque: Secondary | ICD-10-CM | POA: Diagnosis not present

## 2017-08-21 DIAGNOSIS — M48061 Spinal stenosis, lumbar region without neurogenic claudication: Secondary | ICD-10-CM

## 2017-08-21 DIAGNOSIS — E782 Mixed hyperlipidemia: Secondary | ICD-10-CM

## 2017-08-21 DIAGNOSIS — R918 Other nonspecific abnormal finding of lung field: Secondary | ICD-10-CM

## 2017-08-21 DIAGNOSIS — R911 Solitary pulmonary nodule: Secondary | ICD-10-CM | POA: Diagnosis not present

## 2017-08-21 DIAGNOSIS — R718 Other abnormality of red blood cells: Secondary | ICD-10-CM | POA: Diagnosis not present

## 2017-08-21 NOTE — Patient Instructions (Addendum)
Plan:  Blood pressure and heart health:  Increase Losartan to 25 mg twice per day  Will recheck BP in office in 2 weeks  Will likely add a cholesterol medication for heart protection   EKG today looks good  Would also strongly consider adding daily low-dose Aspirin - I can send script for this, too  Other:  Will get labs today for cholesterol and other routine screening  Will repeat CT chest to monitor the lung nodule that was seen back in August

## 2017-08-21 NOTE — Progress Notes (Signed)
dHPI: Bobby Shaw is a 41 y.o. male who  has a past medical history of Alcohol abuse, Chronic diarrhea, Depression with anxiety, Hypertension, LFT elevation, Lung collapse (2001), Murmur, and Seizure (HCC).  he presents to [facility] today, 08/21/17,  for chief complaint of:  Chief Complaint  Patient presents with  . Follow-up    Hypertension    HTN: BP still high, reports (+)back pain, getting ready to have MRI done though I think he means injection. Took BP meds today at 9:30 am. Reports occasional chest tightness but no angina or claudication symptoms, no CP on exertion.   Chronic pain: following with suboxone management. LBP chronic and hx complications from compartment syndrome, hx opiate addiction.   Lung nodule: never followed up after CT in 04/2017, still smoking, due for follow up CT. (+)smokers sough, He is fairly sedentary, does not c/o SOB  Hospitalized this year for EtOH withdrawal causing seizures and cardiac arrest    Past medical, surgical, social and family history reviewed:  Patient Active Problem List   Diagnosis Date Noted  . Lumbar spinal stenosis 08/13/2017  . Pulmonary nodule 05/22/2017  . Biceps tendinitis of left shoulder 03/20/2017  . Seizure due to alcohol withdrawal (HCC) 03/16/2017  . Gait difficulty 03/08/2017  . Tobacco abuse 03/08/2017  . Chronic diastolic congestive heart failure (HCC) 02/23/2017  . Drug use 02/23/2017  . Alcohol use disorder, severe, dependence (HCC) 02/22/2017  . 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 Bipolar disorder (HCC) 10/15/2016  . Right leg swelling 08/22/2016  . Sciatic neuropathy, right 08/16/2016  . Polysubstance abuse (HCC) 07/07/2016  . ANXIETY 02/13/2010  . HYPERTENSION 02/13/2010  . WRIST PAIN, LEFT 02/13/2010    Past Surgical History:  Procedure Laterality Date  . FOOT SURGERY      Social History   Tobacco Use  . Smoking  status: Current Every Day Smoker    Packs/day: 1.00    Years: 20.00    Pack years: 20.00    Types: Cigarettes  . Smokeless tobacco: Never Used  Substance Use Topics  . Alcohol use: Yes    Comment: per chart, former alcohol abuse    Family History  Problem Relation Age of Onset  . Hypertension Father      Current medication list and allergy/intolerance information reviewed:    Current Outpatient Medications  Medication Sig Dispense Refill  . acamprosate (CAMPRAL) 333 MG tablet Take 2 tablets (666 mg total) by mouth 3 (three) times daily with meals. 180 tablet 3  . acetaminophen (TYLENOL) 650 MG CR tablet Take 1 tablet (650 mg total) by mouth every 8 (eight) hours as needed for pain. 90 tablet 3  . amitriptyline (ELAVIL) 75 MG tablet Take 1 tablet by mouth daily.    Marland Kitchen. b complex vitamins capsule Take 1 capsule by mouth daily.    . Buprenorphine HCl-Naloxone HCl (SUBOXONE SL) Place 8 mg under the tongue.    . cholecalciferol (VITAMIN D) 1000 units tablet Take 1,000 Units by mouth daily.    . DULoxetine (CYMBALTA) 60 MG capsule Take 2 capsules (120 mg total) by mouth daily. 180 capsule 3  . gabapentin (NEURONTIN) 400 MG capsule Take 3 capsules (1,200 mg total) by mouth 3 (three) times daily. 270 capsule 5  . ibuprofen (ADVIL,MOTRIN) 800 MG tablet Take 1 tablet (800 mg total) by mouth every 8 (eight) hours as needed. 90 tablet 2  . losartan (COZAAR) 25 MG tablet Take 1  tablet (25 mg total) by mouth daily. 90 tablet 1   No current facility-administered medications for this visit.     No Known Allergies    Review of Systems:  Constitutional:  No  fever, no chills, No recent illness,  HEENT: No  headache, no vision change  Cardiac: +chest pain as per HPI, No  pressure, No palpitations, No  Orthopnea  Respiratory:  No  shortness of breath. No  Cough  Gastrointestinal: No  abdominal pain, No  nausea, No  vomiting  Musculoskeletal: No new myalgia/arthralgia  Neurologic: No   weakness, No  dizziness  Psychiatric: No  concerns with depression, No  concerns with anxiety, No sleep problems, No mood problems  Exam:  BP 140/85   Pulse (!) 111   Temp 98.1 F (36.7 C) (Oral)   Resp 18   Wt 239 lb 11.2 oz (108.7 kg)   SpO2 94%   BMI 35.40 kg/m  HR better w/ EKG   Constitutional: VS see above. General Appearance: alert, well-developed, well-nourished, NAD  Eyes: Normal lids and conjunctive, non-icteric sclera  Ears, Nose, Mouth, Throat: MMM, Normal external inspection ears/nares/mouth/lips/gums.   Respiratory: Normal respiratory effort. no wheeze, no rhonchi, no rales  Cardiovascular: S1/S2 normal, no murmur, no rub/gallop auscultated. RRR. No lower extremity edema.   Neurological: Normal balance/coordination. No tremor.   Skin: warm, dry, intact.   Psychiatric: Normal judgment/insight. Normal mood and affect. Oriented x3.   CT images reviewed with patient from chest study this summer  EKG from his hospitalization showed (+)anterolateral infarct compared w/ previous EKG - report only available  Reviewed discharge summary for hospitalization - I don't see a cardiology consult, cardiac arrest seems mostly attributed to EtOH w/drawal and he had ROSC after one round CPR.     EKG interpretation: Rate: 99 Rhythm: sinus No ST/T changes concerning for acute ischemia/infarct  Previous EKG report only, no images available   Results for orders placed or performed in visit on 08/21/17 (from the past 72 hour(s))  CBC     Status: Abnormal   Collection Time: 08/21/17 12:13 PM  Result Value Ref Range   WBC 7.9 3.8 - 10.8 Thousand/uL   RBC 5.77 4.20 - 5.80 Million/uL   Hemoglobin 13.2 13.2 - 17.1 g/dL   HCT 69.6 29.5 - 28.4 %   MCV 73.8 (L) 80.0 - 100.0 fL   MCH 22.9 (L) 27.0 - 33.0 pg   MCHC 31.0 (L) 32.0 - 36.0 g/dL   RDW 13.2 (H) 44.0 - 10.2 %   Platelets 240 140 - 400 Thousand/uL   MPV 10.0 7.5 - 12.5 fL  COMPLETE METABOLIC PANEL WITH GFR     Status:  Abnormal   Collection Time: 08/21/17 12:13 PM  Result Value Ref Range   Glucose, Bld 102 (H) 65 - 99 mg/dL    Comment: .            Fasting reference interval . For someone without known diabetes, a glucose value between 100 and 125 mg/dL is consistent with prediabetes and should be confirmed with a follow-up test. .    BUN 7 7 - 25 mg/dL   Creat 7.25 3.66 - 4.40 mg/dL   GFR, Est Non African American 111 > OR = 60 mL/min/1.19m2   GFR, Est African American 129 > OR = 60 mL/min/1.79m2   BUN/Creatinine Ratio NOT APPLICABLE 6 - 22 (calc)   Sodium 139 135 - 146 mmol/L   Potassium 4.7 3.5 - 5.3 mmol/L  Chloride 97 (L) 98 - 110 mmol/L   CO2 33 (H) 20 - 32 mmol/L    Comment: Verified by repeat analysis. .    Calcium 9.5 8.6 - 10.3 mg/dL   Total Protein 7.3 6.1 - 8.1 g/dL   Albumin 4.5 3.6 - 5.1 g/dL   Globulin 2.8 1.9 - 3.7 g/dL (calc)   AG Ratio 1.6 1.0 - 2.5 (calc)   Total Bilirubin 0.5 0.2 - 1.2 mg/dL   Alkaline phosphatase (APISO) 136 (H) 40 - 115 U/L   AST 22 10 - 40 U/L   ALT 19 9 - 46 U/L  Lipid panel     Status: Abnormal   Collection Time: 08/21/17 12:13 PM  Result Value Ref Range   Cholesterol 226 (H) <200 mg/dL   HDL 23 (L) >16 mg/dL   Triglycerides 109 (H) <150 mg/dL    Comment: Verified by repeat analysis. Marland Kitchen    LDL Cholesterol (Calc)  mg/dL (calc)    Comment: . LDL cholesterol not calculated. Triglyceride levels greater than 400 mg/dL invalidate calculated LDL results. . Reference range: <100 . Desirable range <100 mg/dL for primary prevention;   <70 mg/dL for patients with CHD or diabetic patients  with > or = 2 CHD risk factors. Marland Kitchen LDL-C is now calculated using the Martin-Hopkins  calculation, which is a validated novel method providing  better accuracy than the Friedewald equation in the  estimation of LDL-C.  Bobby Shaw et al. Lenox Ahr. 6045;409(81): 2061-2068  (http://education.[employer].com/faq/FAQ164)    Total CHOL/HDL Ratio 9.8 (H) <5.0  (calc)   Non-HDL Cholesterol (Calc) 203 (H) <130 mg/dL (calc)    Comment: For patients with diabetes plus 1 major ASCVD risk  factor, treating to a non-HDL-C goal of <100 mg/dL  (LDL-C of <19 mg/dL) is considered a therapeutic  option.   Hemoglobin A1c     Status: Abnormal   Collection Time: 08/21/17 12:13 PM  Result Value Ref Range   Hgb A1c MFr Bld 5.8 (H) <5.7 % of total Hgb    Comment: For someone without known diabetes, a hemoglobin  A1c value between 5.7% and 6.4% is consistent with prediabetes and should be confirmed with a  follow-up test. . For someone with known diabetes, a value <7% indicates that their diabetes is well controlled. A1c targets should be individualized based on duration of diabetes, age, comorbid conditions, and other considerations. . This assay result is consistent with an increased risk of diabetes. . Currently, no consensus exists regarding use of hemoglobin A1c for diagnosis of diabetes for children. .    Mean Plasma Glucose 120 (calc)   eAG (mmol/L) 6.6 (calc)     ASSESSMENT/PLAN: In the midst of multiple other health problems, we discussed some heart health and preventive care issues - coronary atherosclerosis on CT, hx cardiac arrest with MI as result of EtOH withdrawal, though on CT looks like underlying CAD as well. I think secondary prevention with statin and ASA reasonable, would consider beta blocker dependin gon how BP is looking. No recent lipid panel. Last year Glc was fine.    Essential hypertension - Plan: CBC, COMPLETE METABOLIC PANEL WITH GFR, Lipid panel, Hemoglobin A1c  Coronary arteriosclerosis due to lipid rich plaque - Plan: CBC, COMPLETE METABOLIC PANEL WITH GFR, Lipid panel, Hemoglobin A1c, Electrocardiogram report  Spinal stenosis of lumbar region without neurogenic claudication  Pulmonary nodule  Abnormal findings on diagnostic imaging of lung - Plan: CT Chest Wo Contrast  Mixed hyperlipidemia - Plan: Direct  LDL  RBC microcytosis - Plan: Fe+TIBC+Fer    Patient Instructions  Plan:  Blood pressure and heart health:  Increase Losartan to 25 mg twice per day  Will recheck BP in office in 2 weeks  Will likely add a cholesterol medication for heart protection   EKG today looks good  Would also strongly consider adding daily low-dose Aspirin - I can send script for this, too  Other:  Will get labs today for cholesterol and other routine screening  Will repeat CT chest to monitor the lung nodule that was seen back in August     Visit summary with medication list and pertinent instructions was printed for patient to review. All questions at time of visit were answered - patient instructed to contact office with any additional concerns. ER/RTC precautions were reviewed with the patient. Follow-up plan: Return for recheck BP 2 weeks and review lab results w/ Dr A.  Note: Total time spent 40 minutes, greater than 50% of the visit was spent face-to-face counseling and coordinating care for the following: The primary encounter diagnosis was Essential hypertension. Diagnoses of Coronary arteriosclerosis due to lipid rich plaque, Spinal stenosis of lumbar region without neurogenic claudication, Pulmonary nodule, Abnormal findings on diagnostic imaging of lung, Mixed hyperlipidemia, and RBC microcytosis were also pertinent to this visit.Marland Kitchen.  Please note: voice recognition software was used to produce this document, and typos may escape review. Please contact Dr. Lyn HollingsheadAlexander for any needed clarifications.

## 2017-08-23 ENCOUNTER — Ambulatory Visit
Admission: RE | Admit: 2017-08-23 | Discharge: 2017-08-23 | Disposition: A | Discharge status: Home / Self Care | Payer: Medicaid Other | Source: Ambulatory Visit | Attending: Sports Medicine | Admitting: Sports Medicine

## 2017-08-23 MED ORDER — METHYLPREDNISOLONE ACETATE 40 MG/ML INJ SUSP (RADIOLOG
120.0000 mg | Freq: Once | INTRAMUSCULAR | Status: AC
  Administered 2017-08-23: 120 mg via EPIDURAL

## 2017-08-23 MED ORDER — IOPAMIDOL (ISOVUE-M 200) INJECTION 41%
1.0000 mL | Freq: Once | INTRAMUSCULAR | Status: AC
  Administered 2017-08-23: 1 mL via EPIDURAL

## 2017-08-23 NOTE — Discharge Instructions (Signed)

## 2017-08-24 ENCOUNTER — Other Ambulatory Visit: Payer: Self-pay | Admitting: Osteopathic Medicine

## 2017-08-24 LAB — CBC
HCT: 42.6 % (ref 38.5–50.0)
Hemoglobin: 13.2 g/dL (ref 13.2–17.1)
MCH: 22.9 pg — ABNORMAL LOW (ref 27.0–33.0)
MCHC: 31 g/dL — AB (ref 32.0–36.0)
MCV: 73.8 fL — AB (ref 80.0–100.0)
MPV: 10 fL (ref 7.5–12.5)
PLATELETS: 240 10*3/uL (ref 140–400)
RBC: 5.77 10*6/uL (ref 4.20–5.80)
RDW: 16.6 % — AB (ref 11.0–15.0)
WBC: 7.9 10*3/uL (ref 3.8–10.8)

## 2017-08-24 LAB — HEMOGLOBIN A1C
Hgb A1c MFr Bld: 5.8 % of total Hgb — ABNORMAL HIGH (ref <5.7)
MEAN PLASMA GLUCOSE: 120 (calc)
eAG (mmol/L): 6.6 (calc)

## 2017-08-24 LAB — LIPID PANEL
CHOLESTEROL: 226 mg/dL — AB (ref <200)
HDL: 23 mg/dL — AB (ref >40)
Non-HDL Cholesterol (Calc): 203 mg/dL (calc) — ABNORMAL HIGH (ref <130)
TRIGLYCERIDES: 599 mg/dL — AB (ref <150)
Total CHOL/HDL Ratio: 9.8 (calc) — ABNORMAL HIGH (ref <5.0)

## 2017-08-24 LAB — IRON,TIBC AND FERRITIN PANEL
%SAT: 4 % — AB (ref 15–60)
Ferritin: 8 ng/mL — ABNORMAL LOW (ref 20–380)
IRON: 20 ug/dL — AB (ref 50–180)
TIBC: 516 ug/dL — AB (ref 250–425)

## 2017-08-24 LAB — COMPLETE METABOLIC PANEL WITH GFR
AG RATIO: 1.6 (calc) (ref 1.0–2.5)
ALKALINE PHOSPHATASE (APISO): 136 U/L — AB (ref 40–115)
ALT: 19 U/L (ref 9–46)
AST: 22 U/L (ref 10–40)
Albumin: 4.5 g/dL (ref 3.6–5.1)
BILIRUBIN TOTAL: 0.5 mg/dL (ref 0.2–1.2)
BUN: 7 mg/dL (ref 7–25)
CHLORIDE: 97 mmol/L — AB (ref 98–110)
CO2: 33 mmol/L — ABNORMAL HIGH (ref 20–32)
Calcium: 9.5 mg/dL (ref 8.6–10.3)
Creat: 0.81 mg/dL (ref 0.60–1.35)
GFR, EST AFRICAN AMERICAN: 129 mL/min/{1.73_m2} (ref >=60)
GFR, Est Non African American: 111 mL/min/{1.73_m2} (ref >=60)
Globulin: 2.8 g/dL (calc) (ref 1.9–3.7)
Glucose, Bld: 102 mg/dL — ABNORMAL HIGH (ref 65–99)
POTASSIUM: 4.7 mmol/L (ref 3.5–5.3)
Sodium: 139 mmol/L (ref 135–146)
Total Protein: 7.3 g/dL (ref 6.1–8.1)

## 2017-08-24 LAB — TEST AUTHORIZATION

## 2017-08-24 LAB — TEST AUTHORIZATION 2

## 2017-08-24 LAB — LDL CHOLESTEROL, DIRECT: LDL DIRECT: 108 mg/dL — AB (ref <100)

## 2017-08-24 MED ORDER — ATORVASTATIN CALCIUM 20 MG PO TABS: 20.0000 mg | ORAL_TABLET | Freq: Every day | ORAL | 3 refills | Status: DC

## 2017-08-24 MED ORDER — DOCUSATE SODIUM 100 MG PO CAPS: 100.0000 mg | ORAL_CAPSULE | Freq: Three times a day (TID) | ORAL | 3 refills | Status: DC | PRN

## 2017-08-24 MED ORDER — FERROUS SULFATE 325 (65 FE) MG PO TBEC: 325.0000 mg | DELAYED_RELEASE_TABLET | Freq: Three times a day (TID) | ORAL | 11 refills | Status: DC

## 2017-08-24 NOTE — Progress Notes (Signed)
Statin for cardiac risk Iron supplements

## 2017-09-04 ENCOUNTER — Ambulatory Visit: Payer: Medicaid Other | Admitting: Osteopathic Medicine

## 2017-09-11 ENCOUNTER — Telehealth: Payer: Self-pay

## 2017-09-11 NOTE — Telephone Encounter (Signed)
Bobby Shaw would like to stop taking gabapentin due to weight gain. He is taking 700 mg TID. He would like to know how to taper off this medication. Please advise.

## 2017-09-11 NOTE — Telephone Encounter (Signed)
Patient states taking 2 capsules tid, was informed of remaining taper directions. Verbalized understanding, not further questions.

## 2017-09-11 NOTE — Telephone Encounter (Signed)
Not sure how he would be taking 700 mg 3 times daily, I have on his list that he is taking 400 mg capsules, 1200 mg / 3 capsules total, 3 times per day?   At any rate, he can decrease to 2 capsules 3 times per day for a few days, then 1 capsule 3 times per day for a few days, then 1 capsule twice per day, then stop.  This is not a medicine that typically causes a lot of problems from just stopping it - he may experience some sleep issues, nausea or rebound pain.

## 2017-09-14 NOTE — Addendum Note (Signed)
Addended by: Collie SiadICHARDSON, Rakiya Krawczyk M on: 09/14/2017 09:48 AM   Modules accepted: Orders

## 2017-09-24 ENCOUNTER — Other Ambulatory Visit: Payer: Self-pay | Admitting: Osteopathic Medicine

## 2017-10-02 ENCOUNTER — Telehealth: Payer: Self-pay

## 2017-10-02 MED ORDER — AMITRIPTYLINE HCL 75 MG PO TABS: 75.0000 mg | ORAL_TABLET | Freq: Every day | ORAL | 3 refills | Status: DC

## 2017-10-02 NOTE — Telephone Encounter (Signed)
[facility] [place] requesting refill for amtriptyline. Rx written by external dr. Martha ClanPls advise, thanks.

## 2017-10-02 NOTE — Telephone Encounter (Signed)
Refill sent.

## 2017-10-03 NOTE — Telephone Encounter (Signed)
Pt notified of medication refill for [facility]riptyline [facility].

## 2017-10-11 ENCOUNTER — Encounter: Payer: Self-pay | Admitting: Osteopathic Medicine

## 2017-10-11 ENCOUNTER — Telehealth: Payer: Self-pay | Admitting: Osteopathic Medicine

## 2017-10-11 ENCOUNTER — Ambulatory Visit (INDEPENDENT_AMBULATORY_CARE_PROVIDER_SITE_OTHER): Payer: Medicaid Other | Admitting: Osteopathic Medicine

## 2017-10-11 VITALS — BP 135/85 | HR 89 | Temp 97.9°F | Wt 246.1 lb

## 2017-10-11 DIAGNOSIS — R911 Solitary pulmonary nodule: Secondary | ICD-10-CM | POA: Diagnosis not present

## 2017-10-11 DIAGNOSIS — I10 Essential (primary) hypertension: Secondary | ICD-10-CM | POA: Diagnosis not present

## 2017-10-11 DIAGNOSIS — R918 Other nonspecific abnormal finding of lung field: Secondary | ICD-10-CM | POA: Diagnosis not present

## 2017-10-11 DIAGNOSIS — M48061 Spinal stenosis, lumbar region without neurogenic claudication: Secondary | ICD-10-CM | POA: Diagnosis not present

## 2017-10-11 MED ORDER — LOSARTAN POTASSIUM 50 MG PO TABS: 50.0000 mg | ORAL_TABLET | Freq: Every day | ORAL | 3 refills | Status: DC

## 2017-10-11 NOTE — Telephone Encounter (Signed)
[facility]

## 2017-10-11 NOTE — Telephone Encounter (Signed)
At check out pt said his bp meds was increased to 50 mg and pharmacy needs to know this. He will run out of meds this week.

## 2017-10-11 NOTE — Telephone Encounter (Signed)
Pt told me when he was checking out that his bp med was increased to 50mg  and his pharmacy needs to have that information.  He runs out of his bp med this week.

## 2017-10-11 NOTE — Progress Notes (Signed)
HPI: Bobby Shaw is a 42 y.o. male who  has a past medical history of Alcohol abuse, Chronic diarrhea, Depression with anxiety, Hypertension, LFT elevation, Lung collapse (2001), Murmur, and Seizure (HCC).  he presents to [facility] today, 10/11/17,  for chief complaint of:  Chief Complaint  Patient presents with  . Hypertension   HTN: BP a little higher on intake, on recheck a bit better. Has taken meds as usual. No chest pain, pressure, shortness of breath.  Chronic leg/back pain: Doing well coming off of gabapentin.  History of pulmonary nodules: Medicaid previously denied request for repeat chest CT, initial radiology report mentioned follow-up in 3-6 months, I think there is probably some concern with Medicaid that we ordered it sooner than the six-month criteria they were going by. We'll go ahead and order now. Patient reports persistent smoker's cough, has tried taping recently but this didn't quite agree with him. He is somewhat motivated to quit smoking    Past medical, surgical, social and family history reviewed:  Patient Active Problem List   Diagnosis Date Noted  . Coronary arteriosclerosis due to lipid rich plaque 08/21/2017  . Lumbar spinal stenosis 08/13/2017  . Pulmonary nodule 05/22/2017  . Biceps tendinitis of left shoulder 03/20/2017  . Seizure due to alcohol withdrawal (HCC) 03/16/2017  . Gait difficulty 03/08/2017  . Tobacco abuse 03/08/2017  . Chronic diastolic congestive heart failure (HCC) 02/23/2017  . Drug use 02/23/2017  . Alcohol use disorder, severe, dependence (HCC) 02/22/2017  . 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 Bipolar disorder (HCC) 10/15/2016  . Right leg swelling 08/22/2016  . Sciatic neuropathy, right 08/16/2016  . Polysubstance abuse (HCC) 07/07/2016  . ANXIETY 02/13/2010  . Hypertension 02/13/2010  . WRIST PAIN, LEFT 02/13/2010    Past Surgical History:   Procedure Laterality Date  . FOOT SURGERY      Social History   Tobacco Use  . Smoking status: Current Every Day Smoker    Packs/day: 1.00    Years: 20.00    Pack years: 20.00    Types: Cigarettes  . Smokeless tobacco: Never Used  Substance Use Topics  . Alcohol use: Yes    Comment: per chart, former alcohol abuse    Family History  Problem Relation Age of Onset  . Hypertension Father      Current medication list and allergy/intolerance information reviewed:    Current Outpatient Medications  Medication Sig Dispense Refill  . acamprosate (CAMPRAL) 333 MG tablet Take 2 tablets (666 mg total) by mouth 3 (three) times daily with meals. 180 tablet 3  . amitriptyline (ELAVIL) 75 MG tablet Take 1 tablet (75 mg total) by mouth daily. 90 tablet 3  . atorvastatin (LIPITOR) 20 MG tablet Take 1 tablet (20 mg total) by mouth daily. 90 tablet 3  . b complex vitamins capsule Take 1 capsule by mouth daily.    . B Complex-Biotin-FA (SUPER B-50 B COMPLEX) CAPS Take by mouth.    . Buprenorphine HCl-Naloxone HCl (SUBOXONE SL) Place 8 mg under the tongue.    . cholecalciferol (VITAMIN D) 1000 units tablet Take 1,000 Units by mouth daily.    . DULoxetine (CYMBALTA) 60 MG capsule Take 2 capsules (120 mg total) by mouth daily. 180 capsule 3  . ferrous sulfate 325 (65 FE) MG EC tablet Take 1 tablet (325 mg total) by mouth 3 (three) times daily with meals. 90 tablet 11  . gabapentin (NEURONTIN) 400 MG  capsule Take 3 capsules (1,200 mg total) by mouth 3 (three) times daily. 270 capsule 5  . ibuprofen (ADVIL,MOTRIN) 800 MG tablet Take 1 tablet (800 mg total) by mouth every 8 (eight) hours as needed. 90 tablet 2  . losartan (COZAAR) 25 MG tablet Take 1 tablet (25 mg total) by mouth daily. NEEDS APPOINTMENT FOR FUTURE REFILLS. 90 tablet 0   No current facility-administered medications for this visit.     No Known Allergies    Review of Systems:  Constitutional:  No  fever, no chills, No recent  illness,  HEENT: No  headache  Cardiac: No  chest pain, No  pressure, No palpitations, No  Orthopnea  Respiratory:  No  shortness of breath. +Cough  Gastrointestinal: No  abdominal pain, No  nausea,   Musculoskeletal: No new myalgia/arthralgia  Psychiatric: No  concerns with depression, No  concerns with anxiety,  Exam:  BP 135/85   Pulse 89   Temp 97.9 F (36.6 C) (Oral)   Wt 246 lb 1.9 oz (111.6 kg)   BMI 36.35 kg/m   Constitutional: VS see above. General Appearance: alert, well-developed, well-nourished, NAD  Eyes: Normal lids and conjunctive, non-icteric sclera  Ears, Nose, Mouth, Throat: MMM, Normal external inspection ears/nares/mouth/lips/gums.   Neck: No masses, trachea midline.   Respiratory: Normal respiratory effort. no wheeze, no rhonchi, no rales  Cardiovascular: S1/S2 normal, no murmur, no rub/gallop auscultated. RRR. No lower extremity edema.   Neurological: Normal balance/coordination. No tremor.   Skin: warm, dry, intact.     Psychiatric: Normal judgment/insight. Normal mood and affect. Oriented x3.   See previous CT chest results 05/22/2017: "6.3 mm average part solid ground glass nodule in the right upper lobe adjacent to a calcified granuloma. Noncontrast chest CT at 3-6 months is recommended."  ASSESSMENT/PLAN:   Essential hypertension - Stable on current medications, would like to hold off on repeat blood work for now.  Spinal stenosis of lumbar region without neurogenic claudication  Pulmonary nodule  Abnormal findings on diagnostic imaging of lung - Plan: CT Chest Wo Contrast     Visit summary with medication list and pertinent instructions was printed for patient to review. All questions at time of visit were answered - patient instructed to contact office with any additional concerns. ER/RTC precautions were reviewed with the patient.   Follow-up plan: Return for Recheck blood pressure 3-4 months, wlil get labs at that visit also  .  Note: Total time spent 25 minutes, greater than 50% of the visit was spent face-to-face counseling and coordinating care for the following: The primary encounter diagnosis was Essential hypertension. Diagnoses of Spinal stenosis of lumbar region without neurogenic claudication, Pulmonary nodule, and Abnormal findings on diagnostic imaging of lung were also pertinent to this visit.Marland Kitchen.  Please note: voice recognition software was used to produce this document, and typos may escape review. Please contact Dr. Lyn HollingsheadAlexander for any needed clarifications.

## 2017-10-17 ENCOUNTER — Ambulatory Visit (INDEPENDENT_AMBULATORY_CARE_PROVIDER_SITE_OTHER): Payer: Medicaid Other

## 2017-10-17 DIAGNOSIS — K760 Fatty (change of) liver, not elsewhere classified: Secondary | ICD-10-CM | POA: Diagnosis not present

## 2017-10-17 DIAGNOSIS — I2510 Atherosclerotic heart disease of native coronary artery without angina pectoris: Secondary | ICD-10-CM | POA: Diagnosis not present

## 2017-10-17 DIAGNOSIS — R590 Localized enlarged lymph nodes: Secondary | ICD-10-CM

## 2017-10-17 DIAGNOSIS — R918 Other nonspecific abnormal finding of lung field: Secondary | ICD-10-CM | POA: Diagnosis not present

## 2017-10-17 DIAGNOSIS — F17200 Nicotine dependence, unspecified, uncomplicated: Secondary | ICD-10-CM

## 2017-11-01 ENCOUNTER — Encounter: Payer: Self-pay | Admitting: Osteopathic Medicine

## 2017-11-01 ENCOUNTER — Ambulatory Visit (INDEPENDENT_AMBULATORY_CARE_PROVIDER_SITE_OTHER): Payer: Medicaid Other | Admitting: Osteopathic Medicine

## 2017-11-01 VITALS — BP 133/85 | HR 105 | Temp 98.3°F | Wt 243.0 lb

## 2017-11-01 DIAGNOSIS — J690 Pneumonitis due to inhalation of food and vomit: Secondary | ICD-10-CM | POA: Diagnosis not present

## 2017-11-01 DIAGNOSIS — T43014A Poisoning by tricyclic antidepressants, undetermined, initial encounter: Secondary | ICD-10-CM | POA: Diagnosis not present

## 2017-11-01 DIAGNOSIS — Z9289 Personal history of other medical treatment: Secondary | ICD-10-CM | POA: Diagnosis not present

## 2017-11-01 DIAGNOSIS — G4701 Insomnia due to medical condition: Secondary | ICD-10-CM

## 2017-11-01 DIAGNOSIS — E876 Hypokalemia: Secondary | ICD-10-CM | POA: Diagnosis not present

## 2017-11-01 MED ORDER — GENERIC EXTERNAL MEDICATION
Status: DC
Start: ? — End: 2017-11-01

## 2017-11-01 MED ORDER — DOCUSATE SODIUM 100 MG PO CAPS
100.00 | ORAL_CAPSULE | ORAL | Status: DC
Start: 2017-10-31 — End: 2017-11-01

## 2017-11-01 MED ORDER — BISACODYL 10 MG RE SUPP
10.00 | RECTAL | Status: DC
Start: ? — End: 2017-11-01

## 2017-11-01 MED ORDER — ACETAMINOPHEN 325 MG PO TABS
650.00 | ORAL_TABLET | ORAL | Status: DC
Start: ? — End: 2017-11-01

## 2017-11-01 MED ORDER — ALBUTEROL SULFATE (2.5 MG/3ML) 0.083% IN NEBU
2.50 | INHALATION_SOLUTION | RESPIRATORY_TRACT | Status: DC
Start: 2017-10-31 — End: 2017-11-01

## 2017-11-01 MED ORDER — LOSARTAN POTASSIUM 50 MG PO TABS
50.00 | ORAL_TABLET | ORAL | Status: DC
Start: 2017-10-31 — End: 2017-11-01

## 2017-11-01 MED ORDER — INFLUENZA VAC SUBUNIT QUAD 0.5 ML IM SUSY
0.50 | PREFILLED_SYRINGE | INTRAMUSCULAR | Status: DC
Start: ? — End: 2017-11-01

## 2017-11-01 MED ORDER — HEPARIN SODIUM (PORCINE) 5000 UNIT/ML IJ SOLN
INTRAMUSCULAR | Status: DC
Start: 2017-10-31 — End: 2017-11-01

## 2017-11-01 MED ORDER — QUETIAPINE FUMARATE 100 MG PO TABS: 100.0000 mg | ORAL_TABLET | Freq: Every day | ORAL | 0 refills | Status: DC

## 2017-11-01 MED ORDER — SODIUM CHLORIDE 0.9 % IV SOLN
INTRAVENOUS | Status: DC
Start: ? — End: 2017-11-01

## 2017-11-01 MED ORDER — DULOXETINE HCL 30 MG PO CPEP
120.00 | ORAL_CAPSULE | ORAL | Status: DC
Start: 2017-10-31 — End: 2017-11-01

## 2017-11-01 MED ORDER — FUROSEMIDE 10 MG/ML IJ SOLN
40.00 | INTRAMUSCULAR | Status: DC
Start: 2017-10-31 — End: 2017-11-01

## 2017-11-01 MED ORDER — GENERIC EXTERNAL MEDICATION
75.00 | Status: DC
Start: 2017-10-31 — End: 2017-11-01

## 2017-11-01 MED ORDER — GABAPENTIN 300 MG PO CAPS
300.00 | ORAL_CAPSULE | ORAL | Status: DC
Start: 2017-10-31 — End: 2017-11-01

## 2017-11-01 MED ORDER — NICOTINE 21 MG/24HR TD PT24
MEDICATED_PATCH | TRANSDERMAL | Status: DC
Start: 2017-10-31 — End: 2017-11-01

## 2017-11-01 MED ORDER — FOLIC ACID 1 MG PO TABS
1.00 | ORAL_TABLET | ORAL | Status: DC
Start: 2017-10-31 — End: 2017-11-01

## 2017-11-01 MED ORDER — ALBUTEROL SULFATE (2.5 MG/3ML) 0.083% IN NEBU
2.50 | INHALATION_SOLUTION | RESPIRATORY_TRACT | Status: DC
Start: ? — End: 2017-11-01

## 2017-11-01 MED ORDER — PNEUMOCOCCAL VAC POLYVALENT 25 MCG/0.5ML IJ INJ
0.50 | INJECTION | INTRAMUSCULAR | Status: DC
Start: ? — End: 2017-11-01
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
## 2017-11-15 ENCOUNTER — Ambulatory Visit (INDEPENDENT_AMBULATORY_CARE_PROVIDER_SITE_OTHER): Payer: Medicaid Other | Admitting: Physician Assistant

## 2017-11-15 ENCOUNTER — Encounter: Payer: Self-pay | Admitting: Physician Assistant

## 2017-11-15 VITALS — BP 133/82 | HR 90 | Wt 244.0 lb

## 2017-11-15 DIAGNOSIS — I10 Essential (primary) hypertension: Secondary | ICD-10-CM

## 2017-11-15 DIAGNOSIS — E781 Pure hyperglyceridemia: Secondary | ICD-10-CM | POA: Diagnosis not present

## 2017-11-15 DIAGNOSIS — R748 Abnormal levels of other serum enzymes: Secondary | ICD-10-CM

## 2017-11-15 DIAGNOSIS — F10982 Alcohol use, unspecified with alcohol-induced sleep disorder: Secondary | ICD-10-CM | POA: Diagnosis not present

## 2017-11-15 DIAGNOSIS — F1020 Alcohol dependence, uncomplicated: Secondary | ICD-10-CM | POA: Diagnosis not present

## 2017-11-15 DIAGNOSIS — G8929 Other chronic pain: Secondary | ICD-10-CM

## 2017-11-15 DIAGNOSIS — M792 Neuralgia and neuritis, unspecified: Secondary | ICD-10-CM

## 2017-11-15 DIAGNOSIS — Z1321 Encounter for screening for nutritional disorder: Secondary | ICD-10-CM | POA: Diagnosis not present

## 2017-11-15 DIAGNOSIS — F1994 Other psychoactive substance use, unspecified with psychoactive substance-induced mood disorder: Secondary | ICD-10-CM

## 2017-11-15 DIAGNOSIS — D509 Iron deficiency anemia, unspecified: Secondary | ICD-10-CM

## 2017-11-15 MED ORDER — GABAPENTIN 400 MG PO CAPS: 400.0000 mg | ORAL_CAPSULE | Freq: Every day | ORAL | 5 refills | Status: DC

## 2017-11-15 MED ORDER — QUETIAPINE FUMARATE 100 MG PO TABS: 100.0000 mg | ORAL_TABLET | Freq: Every day | ORAL | 3 refills | Status: DC

## 2017-11-15 MED ORDER — LOSARTAN POTASSIUM 100 MG PO TABS: 100.0000 mg | ORAL_TABLET | Freq: Every day | ORAL | 3 refills | Status: DC
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
## 2017-11-15 NOTE — Patient Instructions (Addendum)
For your blood pressure: - Goal <130/80 - increase your Losartan to 100 mg daily - monitor and log blood pressures at home - check around the same time each day in a relaxed setting - Limit salt to <2000 mg/day - Follow DASH eating plan - limit alcohol to 2 standard drinks per day for men and 1 per day for women - avoid tobacco products - weight loss: 7% of current body weight - follow-up every 6 months for your blood pressure   For insomnia: - limit sleep to 9 hours per night - maintain a regular sleep schedule - reduce Seroquel if needed to 50 mg at bedtime so that you are not oversleeping  Alcohol Withdrawal When a person who drinks a lot of alcohol stops drinking, he or she may go through alcohol withdrawal. Alcohol withdrawal causes problems. It can make you feel:  Tired (fatigued).  Sad (depressed).  Fearful (anxious).  Grouchy (irritable).  Not hungry.  Sick to your stomach (nauseous).  Shaky.  It can also make you have:  Nightmares.  Trouble sleeping.  Trouble thinking clearly.  Mood swings.  Clammy skin.  Very bad sweating.  A very fast heartbeat.  Shaking that you cannot control (tremor).  Having a fever.  A fit of movements that you cannot control (seizure).  Confusion.  Throwing up (vomiting).  Feeling or seeing things that are not there (hallucinations).  Follow these instructions at home:  Take medicines and vitamins only as told by your doctor.  Do not drink alcohol.  Have someone around in case you need help.  Drink enough fluid to keep your pee (urine) clear or pale yellow.  Think about joining a group to help you stop drinking. Contact a doctor if:  Your problems get worse.  Your problems do not go away.  You cannot eat or drink without throwing up.  You are having a hard time not drinking alcohol.  You cannot stop drinking alcohol. Get help right away if:  You feel your heart beating differently than  usual.  Your chest hurts.  You have trouble breathing.  You have very bad problems, like: ? A fever. ? A fit of movements that you cannot control. ? Being very confused. ? Feeling or seeing things that are not there. This information is not intended to replace advice given to you by your health care provider. Make sure you discuss any questions you have with your health care provider. Document Released: 02/28/2008 Document Revised: 02/17/2016 Document Reviewed: 06/30/2014 Elsevier Interactive Patient Education  2018 [employer].   Alcohol Use Disorder Alcohol use disorder is when your drinking disrupts your daily life. When you have this condition, you drink too much alcohol and you cannot control your drinking. Alcohol use disorder can cause serious problems with your physical health. It can affect your brain, heart, liver, pancreas, immune system, stomach, and intestines. Alcohol use disorder can increase your risk for certain cancers and cause problems with your mental health, such as depression, anxiety, psychosis, delirium, and dementia. People with this disorder risk hurting themselves and others. What are the causes? This condition is caused by drinking too much alcohol over time. It is not caused by drinking too much alcohol only one or two times. Some people with this condition drink alcohol to cope with or escape from negative life events. Others drink to relieve pain or symptoms of mental illness. What increases the risk? You are more likely to develop this condition if:  You have a  family history of alcohol use disorder.  Your culture encourages drinking to the point of intoxication, or makes alcohol easy to get.  You had a mood or conduct disorder in childhood.  You have been a victim of abuse.  You are an adolescent and: ? You have poor grades or difficulties in school. ? Your caregivers do not talk to you about saying no to alcohol, or supervise your  activities. ? You are impulsive or you have trouble with self-control.  What are the signs or symptoms? Symptoms of this condition include:  Drinkingmore than you want to.  Drinking for longer than you want to.  Trying several times to drink less or to control your drinking.  Spending a lot of time getting alcohol, drinking, or recovering from drinking.  Craving alcohol.  Having problems at work, at school, or at home due to drinking.  Having problems in relationships due to drinking.  Drinking when it is dangerous to drink, such as before driving a car.  Continuing to drink even though you know you might have a physical or mental problem related to drinking.  Needing more and more alcohol to get the same effect you want from the alcohol (building up tolerance).  Having symptoms of withdrawal when you stop drinking. Symptoms of withdrawal include: ? Fatigue. ? Nightmares. ? Trouble sleeping. ? Depression. ? Anxiety. ? Fever. ? Seizures. ? Severe confusion. ? Feeling or seeing things that are not there (hallucinations). ? Tremors. ? Rapid heart rate. ? Rapid breathing. ? High blood pressure.  Drinking to avoid symptoms of withdrawal.  How is this diagnosed? This condition is diagnosed with an assessment. Your health care provider may start the assessment by asking three or four questions about your drinking. Your health care provider may perform a physical exam or do lab tests to see if you have physical problems resulting from alcohol use. She or he may refer you to a mental health professional for evaluation. How is this treated? Some people with alcohol use disorder are able to reduce their alcohol use to low-risk levels. Others need to completely quit drinking alcohol. When necessary, mental health professionals with specialized training in substance use treatment can help. Your health care provider can help you decide how severe your alcohol use disorder is and  what type of treatment you need. The following forms of treatment are available:  Detoxification. Detoxification involves quitting drinking and using prescription medicines within the first week to help lessen withdrawal symptoms. This treatment is important for people who have had withdrawal symptoms before and for heavy drinkers who are likely to have withdrawal symptoms. Alcohol withdrawal can be dangerous, and in severe cases, it can cause death. Detoxification may be provided in a home, community, or primary care setting, or in a hospital or substance use treatment facility.  Counseling. This treatment is also called talk therapy. It is provided by substance use treatment counselors. A counselor can address the reasons you use alcohol and suggest ways to keep you from drinking again or to prevent problem drinking. The goals of talk therapy are to: ? Find healthy activities and ways for you to cope with stress. ? Identify and avoid the things that trigger your alcohol use. ? Help you learn how to handle cravings.  Medicines.Medicines can help treat alcohol use disorder by: ? Decreasing alcohol cravings. ? Decreasing the positive feeling you have when you drink alcohol. ? Causing an uncomfortable physical reaction when you drink alcohol (aversion therapy).  Support  groups. Support groups are led by people who have quit drinking. They provide emotional support, advice, and guidance.  These forms of treatment are often combined. Some people with this condition benefit from a combination of treatments provided by specialized substance use treatment centers. Follow these instructions at home:  Take over-the-counter and prescription medicines only as told by your health care provider.  Check with your health care provider before starting any new medicines.  Ask friends and family members not to offer you alcohol.  Avoid situations where alcohol is served, including gatherings where others are  drinking alcohol.  Create a plan for what to do when you are tempted to use alcohol.  Find hobbies or activities that you enjoy that do not include alcohol.  Keep all follow-up visits as told by your health care provider. This is important. How is this prevented?  If you drink, limit alcohol intake to no more than 1 drink a day for nonpregnant women and 2 drinks a day for men. One drink equals 12 oz of beer, 5 oz of wine, or 1 oz of hard liquor.  If you have a mental health condition, get treatment and support.  Do not give alcohol to adolescents.  If you are an adolescent: ? Do not drink alcohol. ? Do not be afraid to say no if someone offers you alcohol. Speak up about why you do not want to drink. You can be a positive role model for your friends and set a good example for those around you by not drinking alcohol. ? If your friends drink, spend time with others who do not drink alcohol. Make new friends who do not use alcohol. ? Find healthy ways to manage stress and emotions, such as meditation or deep breathing, exercise, spending time in nature, listening to music, or talking with a trusted friend or family member. Contact a health care provider if:  You are not able to take your medicines as told.  Your symptoms get worse.  You return to drinking alcohol (relapse) and your symptoms get worse. Get help right away if:  You have thoughts about hurting yourself or others. If you ever feel like you may hurt yourself or others, or have thoughts about taking your own life, get help right away. You can go to your nearest emergency department or call:  Your local emergency services (911 in the [place]  A suicide crisis helpline, such as the National Suicide Prevention Lifeline at 843 809 52221-802-030-4109. This is open 24 hours a day.  Summary  Alcohol use disorder is when your drinking disrupts your daily life. When you have this condition, you drink too much alcohol and you cannot control  your drinking.  Treatment may include detoxification, counseling, medicine, and support groups.  Ask friends and family members not to offer you alcohol. Avoid situations where alcohol is served.  Get help right away if you have thoughts about hurting yourself or others. This information is not intended to replace advice given to you by your health care provider. Make sure you discuss any questions you have with your health care provider. Document Released: 10/19/2004 Document Revised: 06/08/2016 Document Reviewed: 06/08/2016 Elsevier Interactive Patient Education  [employer]2018 Else[employer] Inc[employer]

## 2017-11-19 LAB — GAMMA GT: GGT: 59 U/L (ref 3–95)

## 2017-11-19 LAB — COMPREHENSIVE METABOLIC PANEL
AG Ratio: 1.7 (calc) (ref 1.0–2.5)
ALKALINE PHOSPHATASE (APISO): 119 U/L — AB (ref 40–115)
ALT: 30 U/L (ref 9–46)
AST: 25 U/L (ref 10–40)
Albumin: 4.7 g/dL (ref 3.6–5.1)
BUN: 8 mg/dL (ref 7–25)
CHLORIDE: 100 mmol/L (ref 98–110)
CO2: 32 mmol/L (ref 20–32)
CREATININE: 0.72 mg/dL (ref 0.60–1.35)
Calcium: 10 mg/dL (ref 8.6–10.3)
GLOBULIN: 2.8 g/dL (ref 1.9–3.7)
GLUCOSE: 84 mg/dL (ref 65–99)
Potassium: 4.1 mmol/L (ref 3.5–5.3)
Sodium: 140 mmol/L (ref 135–146)
Total Bilirubin: 0.7 mg/dL (ref 0.2–1.2)
Total Protein: 7.5 g/dL (ref 6.1–8.1)

## 2017-11-19 LAB — CBC
HCT: 46.8 % (ref 38.5–50.0)
Hemoglobin: 15.8 g/dL (ref 13.2–17.1)
MCH: 27.3 pg (ref 27.0–33.0)
MCHC: 33.8 g/dL (ref 32.0–36.0)
MCV: 81 fL (ref 80.0–100.0)
MPV: 10.2 fL (ref 7.5–12.5)
PLATELETS: 207 10*3/uL (ref 140–400)
RBC: 5.78 10*6/uL (ref 4.20–5.80)
RDW: 19.5 % — AB (ref 11.0–15.0)
WBC: 7.7 10*3/uL (ref 3.8–10.8)

## 2017-11-19 LAB — VITAMIN B1: Vitamin B1 (Thiamine): 22 nmol/L (ref 8–30)

## 2017-11-19 LAB — LIPID PANEL W/REFLEX DIRECT LDL
CHOL/HDL RATIO: 3.8 (calc) (ref <5.0)
Cholesterol: 184 mg/dL (ref <200)
HDL: 49 mg/dL (ref >40)
LDL CHOLESTEROL (CALC): 101 mg/dL — AB
Non-HDL Cholesterol (Calc): 135 mg/dL (calc) — ABNORMAL HIGH (ref <130)
Triglycerides: 219 mg/dL — ABNORMAL HIGH (ref <150)

## 2017-11-19 LAB — IRON,TIBC AND FERRITIN PANEL
%SAT: 19 % (calc) (ref 15–60)
FERRITIN: 28 ng/mL (ref 20–380)
IRON: 80 ug/dL (ref 50–180)
TIBC: 418 ug/dL (ref 250–425)

## 2017-11-19 LAB — B12 AND FOLATE PANEL
Folate: 15.4 ng/mL
VITAMIN B 12: 742 pg/mL (ref 200–1100)

## 2017-11-19 NOTE — Progress Notes (Signed)
Good morning Bobby HazardMatthew,  Your labs look better. Your triglycerides have come down quite a bit. Your iron levels are back in a normal range and anemia has resolved.   Continue taking the vitamins Dr. Lyn HollingsheadAlexander has prescribed you. You can reduce your iron to three times daily with meals on Mondays Wednesdays and Fridays.   Best, Vinetta Bergamoharley

## 2017-11-25 ENCOUNTER — Encounter: Payer: Self-pay | Admitting: Physician Assistant

## 2017-11-25 DIAGNOSIS — G8929 Other chronic pain: Secondary | ICD-10-CM | POA: Insufficient documentation

## 2017-11-25 DIAGNOSIS — E781 Pure hyperglyceridemia: Secondary | ICD-10-CM | POA: Insufficient documentation

## 2017-11-25 DIAGNOSIS — M792 Neuralgia and neuritis, unspecified: Secondary | ICD-10-CM

## 2017-11-25 DIAGNOSIS — G478 Other sleep disorders: Secondary | ICD-10-CM | POA: Insufficient documentation

## 2017-11-25 DIAGNOSIS — D509 Iron deficiency anemia, unspecified: Secondary | ICD-10-CM | POA: Insufficient documentation

## 2017-11-25 DIAGNOSIS — F1994 Other psychoactive substance use, unspecified with psychoactive substance-induced mood disorder: Secondary | ICD-10-CM | POA: Insufficient documentation

## 2017-11-25 DIAGNOSIS — F10982 Alcohol use, unspecified with alcohol-induced sleep disorder: Secondary | ICD-10-CM | POA: Insufficient documentation

## 2017-11-25 DIAGNOSIS — R748 Abnormal levels of other serum enzymes: Secondary | ICD-10-CM | POA: Insufficient documentation

## 2017-11-25 DIAGNOSIS — G4700 Insomnia, unspecified: Secondary | ICD-10-CM | POA: Insufficient documentation

## 2017-11-29 ENCOUNTER — Other Ambulatory Visit: Payer: Self-pay | Admitting: Osteopathic Medicine

## 2017-12-03 ENCOUNTER — Other Ambulatory Visit: Payer: Self-pay | Admitting: Osteopathic Medicine

## 2017-12-13 ENCOUNTER — Encounter: Payer: Self-pay | Admitting: Osteopathic Medicine

## 2017-12-13 ENCOUNTER — Ambulatory Visit (INDEPENDENT_AMBULATORY_CARE_PROVIDER_SITE_OTHER): Payer: Medicaid Other | Admitting: Osteopathic Medicine

## 2017-12-13 ENCOUNTER — Telehealth: Payer: Self-pay

## 2017-12-13 VITALS — BP 135/63 | HR 91 | Temp 98.4°F | Wt 251.0 lb

## 2017-12-13 DIAGNOSIS — G4709 Other insomnia: Secondary | ICD-10-CM | POA: Diagnosis not present

## 2017-12-13 DIAGNOSIS — F1011 Alcohol abuse, in remission: Secondary | ICD-10-CM

## 2017-12-13 DIAGNOSIS — N529 Male erectile dysfunction, unspecified: Secondary | ICD-10-CM

## 2017-12-13 DIAGNOSIS — F1994 Other psychoactive substance use, unspecified with psychoactive substance-induced mood disorder: Secondary | ICD-10-CM | POA: Diagnosis not present

## 2017-12-13 MED ORDER — SILDENAFIL CITRATE 20 MG PO TABS: 20.0000 mg | ORAL_TABLET | ORAL | 3 refills | Status: DC | PRN

## 2017-12-13 NOTE — Progress Notes (Signed)
HPI: Bobby Shaw is a 42 y.o. male who  has a past medical history of Alcohol abuse, Chronic diarrhea, Depression with anxiety, Hypertension, LFT elevation, Lung collapse (2001), Murmur, and Seizure (HCC).  he presents to [facility] today, 12/13/17,  for chief complaint of:  Recheck insomnia  Recently seen by a colleague when I had to be out of the office. We had been adjusting medications for insomnia.I started him back on Seroquel at our last visit together 11/01/2017 and asked him to come back to 11/15/2017 for follow-up. Vinetta Bergamo saw him that day, Seroquel was causing him to oversleep. Decrease dose to one half tablet. He was also referred to psychiatry, other labs were looking okay.   Reports he hasn't decreased Seroquel to 1/2 tablet, but he;s sleeping okay. His sponsor for AA is on third shift and their time together messes with his schedule a little bit. He has stayed sober.  At this point, he feels he is doing well, checking infrequently with Alcoholics Anonymous. Penicillin she for him but he is optimistic that he may be able to get better/stronger.     Past medical history, surgical history, social history and family history reviewed. No updates needed.   Current medication list and allergy/intolerance information reviewed.    Current Outpatient Medications on File Prior to Visit  Medication Sig Dispense Refill  . atorvastatin (LIPITOR) 20 MG tablet Take 1 tablet (20 mg total) by mouth daily. 90 tablet 3  . b complex vitamins capsule Take 1 capsule by mouth daily.    . Buprenorphine HCl-Naloxone HCl (SUBOXONE SL) Place 8 mg under the tongue.    . cholecalciferol (VITAMIN D) 1000 units tablet Take 1,000 Units by mouth daily.    . DULoxetine (CYMBALTA) 60 MG capsule Take 2 capsules (120 mg total) by mouth daily. 180 capsule 3  . ferrous sulfate 325 (65 FE) MG EC tablet Take 1 tablet (325 mg total) by mouth 3 (three) times daily with meals.  90 tablet 11  . gabapentin (NEURONTIN) 400 MG capsule Take 1 capsule (400 mg total) by mouth daily. (Patient taking differently: Take 400 mg by mouth 3 (three) times daily. ) 270 capsule 5  . losartan (COZAAR) 100 MG tablet Take 1 tablet (100 mg total) by mouth daily. 90 tablet 3  . QUEtiapine (SEROQUEL) 100 MG tablet Take 1 tablet (100 mg total) by mouth at bedtime. Start with 1/2 tablet and see how this affects you 30 tablet 3  . B Complex-Biotin-FA (SUPER B-50 B COMPLEX) CAPS Take by mouth.    Marland Kitchen ibuprofen (ADVIL,MOTRIN) 800 MG tablet Take 1 tablet (800 mg total) by mouth every 8 (eight) hours as needed. (Patient not taking: Reported on 12/13/2017) 90 tablet 2   No current facility-administered medications on file prior to visit.    No Known Allergies    Review of Systems:  Constitutional: No recent illness  HEENT: No  headache, no vision change  Cardiac: No  chest pain, No  pressure, No palpitations  Respiratory:  No  shortness of breath. No  Cough  Gastrointestinal: No  abdominal pain, no change on bowel habits  Musculoskeletal: No new myalgia/arthralgia  Skin: No  Rash  Hem/Onc: No  easy bruising/bleeding, No  abnormal lumps/bumps  Neurologic: No  weakness, No  Dizziness  Psychiatric: +concerns with depression - stable, No  concerns with anxiety  Exam:  BP 135/63 (BP Location: Left Arm)   Pulse 91   Temp 98.4 F (36.9  C) (Oral)   Wt 251 lb 0.6 oz (113.9 kg)   BMI 37.07 kg/m   Constitutional: VS see above. General Appearance: alert, well-developed, well-nourished, NAD  Eyes: Normal lids and conjunctive, non-icteric sclera  Ears, Nose, Mouth, Throat: MMM, Normal external inspection ears/nares/mouth/lips/gums.  Neck: No masses, trachea midline.   Respiratory: Normal respiratory effort. no wheeze, no rhonchi, no rales  Cardiovascular: S1/S2 normal, no murmur, no rub/gallop auscultated. RRR.   Musculoskeletal: Gait normal. Symmetric and independent movement of  all extremities  Neurological: Normal balance/coordination. No tremor.  Skin: warm, dry, intact.   Psychiatric: Normal judgment/insight. Normal mood and affect. Oriented x3.     ASSESSMENT/PLAN:   Patient would like to leave medications as is for now, he is overall feeling pretty good and wants to just see how things go for a little while. I'm agreeable, we'll have him follow-up in another 3 months or so. Encouraged continuation with therapy  Other insomnia  Substance induced mood disorder (HCC)  Alcohol abuse, in remission  Erectile dysfunction, unspecified erectile dysfunction type     Meds ordered this encounter  Medications  . sildenafil (REVATIO) 20 MG tablet    Sig: Take 1-4 tablets (20-80 mg total) by mouth as needed.    Dispense:  50 tablet    Refill:  3    There are no Patient Instructions on file for this visit.   Follow-up plan: Return in about 3 months (around 03/15/2018) for recheck sleep issues .  Visit summary with medication list and pertinent instructions was printed for patient to review, alert us if any changes needed. All questions at time of visit were answered - patient instructed to contact office with any additional concerns. ER/RTC precautions were reviewed with the patient and understanding verbalized.   Note: Total time spent 25 minutes, greater than 50% of the visit was spent face-to-face counseling and coordinating care for the following: The primary encounter diagnosis was Other insomnia. Diagnoses of Substance induced mood disorder (HCC), Alcohol abuse, in remission, and Erectile dysfunction, unspecified erectile dysfunction type were also pertinent to this visit.Marland Kitchen.  Please note: voice recognition software was used to produce this document, and typos may escape review. Please contact Dr. Lyn HollingsheadAlexander for any needed clarifications.

## 2017-12-13 NOTE — Telephone Encounter (Signed)
Bobby CarwinFYI  [name] called to report he had an overnight o2 study. He had less than 88 % for 146 minutes.

## 2017-12-13 NOTE — Telephone Encounter (Signed)
Noted, he is following with pulmonary

## 2018-01-09 ENCOUNTER — Ambulatory Visit: Payer: Medicaid Other | Admitting: Osteopathic Medicine

## 2018-01-22 ENCOUNTER — Ambulatory Visit (INDEPENDENT_AMBULATORY_CARE_PROVIDER_SITE_OTHER): Payer: Medicaid Other | Admitting: Osteopathic Medicine

## 2018-01-22 ENCOUNTER — Encounter: Payer: Self-pay | Admitting: Osteopathic Medicine

## 2018-01-22 VITALS — BP 144/68 | HR 89 | Temp 98.3°F | Wt 247.7 lb

## 2018-01-22 DIAGNOSIS — K429 Umbilical hernia without obstruction or gangrene: Secondary | ICD-10-CM

## 2018-01-22 DIAGNOSIS — K5900 Constipation, unspecified: Secondary | ICD-10-CM

## 2018-01-22 DIAGNOSIS — D509 Iron deficiency anemia, unspecified: Secondary | ICD-10-CM | POA: Diagnosis not present

## 2018-01-22 DIAGNOSIS — F1011 Alcohol abuse, in remission: Secondary | ICD-10-CM

## 2018-01-22 DIAGNOSIS — Z23 Encounter for immunization: Secondary | ICD-10-CM

## 2018-01-22 DIAGNOSIS — E781 Pure hyperglyceridemia: Secondary | ICD-10-CM | POA: Diagnosis not present

## 2018-01-22 NOTE — Patient Instructions (Addendum)
Plan:  Stop iron supplements   Try MiraLax 1-2 times per day as needed for severe constipation   Keep taking the statin medications  Hernia - optional surgery but I'd just leave it alone

## 2018-01-22 NOTE — Progress Notes (Signed)
HPI: Bobby Shaw is a 42 y.o. male who  has a past medical history of Alcohol abuse, Chronic diarrhea, Depression with anxiety, Hypertension, LFT elevation, Lung collapse (2001), Murmur, and Seizure (HCC).  he presents to [facility] today, 01/22/18,  for chief complaint of:  Hernia Med question: statin and iron   Hernia: umbilical area, present for years, bulging but nonpainful, though seems to be enlarging.   Constipation has been bothering him. Increased water and fiber, is taking stool softener daily. No blood in stool. No abdominal pain.   Inquires about coming off statin and iron supplements.   HLD: statins have certainly improved TC, LDL and TG.   Iron deficiency: iron supplements have certainly improved his numbers.      Past medical history, surgical history, and family history reviewed.  Current medication list and allergy/intolerance information reviewed.   (See remainder of HPI, as well as ROS, PE below)    ASSESSMENT/PLAN:   Umbilical hernia without obstruction and without gangrene  Need for Tdap vaccination - Plan: Tdap vaccine greater than or equal to 7yo IM  Constipation, unspecified constipation type  Alcohol abuse, in remission  Iron deficiency anemia, unspecified iron deficiency anemia type - can d/c or cut dose in half and see if this improves constipation, will leave on list for now   Hypertriglyceridemia - advised continue statins      Patient Instructions  Plan:  Stop iron supplements   Try MiraLax 1-2 times per day as needed for severe constipation   Keep taking the statin medications  Hernia - optional surgery but I'd just leave it alone    Follow-up plan: Return if symptoms worsen or fail to improve.    ################################# ################################# #################################   Outpatient Encounter Medications as of 01/22/2018  Medication Sig Note  .  atorvastatin (LIPITOR) 20 MG tablet Take 1 tablet (20 mg total) by mouth daily.   Marland Kitchen b complex vitamins capsule Take 1 capsule by mouth daily.   . B Complex-Biotin-FA (SUPER B-50 B COMPLEX) CAPS Take by mouth.   . Buprenorphine HCl-Naloxone HCl (SUBOXONE SL) Place 8 mg under the tongue. 12/13/2017: As per pt - taking 16 mg   . cholecalciferol (VITAMIN D) 1000 units tablet Take 1,000 Units by mouth daily.   . DULoxetine (CYMBALTA) 60 MG capsule Take 2 capsules (120 mg total) by mouth daily.   . ferrous sulfate 325 (65 FE) MG EC tablet Take 1 tablet (325 mg total) by mouth 3 (three) times daily with meals.   . gabapentin (NEURONTIN) 400 MG capsule Take 1 capsule (400 mg total) by mouth daily. (Patient taking differently: Take 400 mg by mouth 3 (three) times daily. )   . ibuprofen (ADVIL,MOTRIN) 800 MG tablet Take 1 tablet (800 mg total) by mouth every 8 (eight) hours as needed.   Marland Kitchen losartan (COZAAR) 100 MG tablet Take 1 tablet (100 mg total) by mouth daily.   . QUEtiapine (SEROQUEL) 100 MG tablet Take 1 tablet (100 mg total) by mouth at bedtime. Start with 1/2 tablet and see how this affects you   . sildenafil (REVATIO) 20 MG tablet Take 1-4 tablets (20-80 mg total) by mouth as needed.    No facility-administered encounter medications on file as of 01/22/2018.    No Known Allergies    Review of Systems:  Constitutional: No recent illness  HEENT: No  headache, no vision change  Cardiac: No  chest pain  Respiratory:  No  shortness of  breath. No  Cough  Gastrointestinal: No  abdominal pain, no change on bowel habits  Musculoskeletal: No new myalgia/arthralgia  Skin: No  Rash  Neurologic: No  weakness, No  Dizziness   Exam:  BP (!) 144/68 (BP Location: Left Arm, Patient Position: Sitting, Cuff Size: Normal)   Pulse 89   Temp 98.3 F (36.8 C) (Oral)   Wt 247 lb 11.2 oz (112.4 kg)   BMI 36.58 kg/m   Constitutional: VS see above. General Appearance: alert, well-developed,  well-nourished, NAD  Eyes: Normal lids and conjunctive, non-icteric sclera  Ears, Nose, Mouth, Throat: MMM, Normal external inspection ears/nares/mouth/lips/gums.  Neck: No masses, trachea midline.   Respiratory: Normal respiratory effort.   Musculoskeletal: Gait favoring R leg, stable from previous exams.   Abdominal: small umbilical hernia reducible   Neurological: Normal balance/coordination. No tremor.  Skin: warm, dry, intact.   Psychiatric: Normal judgment/insight. Normal mood and affect. Oriented x3.   Visit summary with medication list and pertinent instructions was printed for patient to review, alert [place] if any changes needed. All questions at time of visit were answered - patient instructed to contact office with any additional concerns. ER/RTC precautions were reviewed with the patient and understanding verbalized.   Follow-up plan: Return if symptoms worsen or fail to improve.  Note: Total time spent 25 minutes, greater than 50% of the visit was spent face-to-face counseling and coordinating care for the following: The primary encounter diagnosis was Umbilical hernia without obstruction and without gangrene. Diagnoses of Need for Tdap vaccination, Constipation, unspecified constipation type, Alcohol abuse, in remission, Iron deficiency anemia, unspecified iron deficiency anemia type, and Hypertriglyceridemia were also pertinent to this visit.Marland Kitchen  Please note: voice recognition software was used to produce this document, and typos may escape review. Please contact Dr. Lyn Hollingshead for any needed clarifications.

## 2018-02-05 ENCOUNTER — Other Ambulatory Visit: Payer: Self-pay | Admitting: Osteopathic Medicine

## 2018-02-07 ENCOUNTER — Other Ambulatory Visit: Payer: Self-pay

## 2018-02-07 DIAGNOSIS — M792 Neuralgia and neuritis, unspecified: Principal | ICD-10-CM

## 2018-02-07 DIAGNOSIS — G8929 Other chronic pain: Secondary | ICD-10-CM

## 2018-02-07 MED ORDER — GABAPENTIN 400 MG PO CAPS: 400.0000 mg | ORAL_CAPSULE | Freq: Every day | ORAL | 5 refills | Status: DC

## 2018-02-08 ENCOUNTER — Other Ambulatory Visit: Payer: Self-pay | Admitting: Osteopathic Medicine

## 2018-02-08 DIAGNOSIS — M792 Neuralgia and neuritis, unspecified: Principal | ICD-10-CM

## 2018-02-08 DIAGNOSIS — G8929 Other chronic pain: Secondary | ICD-10-CM

## 2018-02-08 MED ORDER — GABAPENTIN 400 MG PO CAPS: 400.0000 mg | ORAL_CAPSULE | Freq: Three times a day (TID) | ORAL | 5 refills | Status: DC

## 2018-02-08 NOTE — Progress Notes (Signed)
Response to request for clarification, patient has been wanting to wean down on these tablets, 3 times a da as needed is fine, can take it less than that

## 2018-02-11 ENCOUNTER — Telehealth: Payer: Self-pay | Admitting: Emergency Medicine

## 2018-02-20 ENCOUNTER — Ambulatory Visit (INDEPENDENT_AMBULATORY_CARE_PROVIDER_SITE_OTHER): Payer: Medicaid Other | Admitting: Osteopathic Medicine

## 2018-02-20 ENCOUNTER — Encounter: Payer: Self-pay | Admitting: Osteopathic Medicine

## 2018-02-20 VITALS — BP 100/87 | HR 102 | Temp 97.9°F | Wt 243.7 lb

## 2018-02-20 DIAGNOSIS — M48062 Spinal stenosis, lumbar region with neurogenic claudication: Secondary | ICD-10-CM | POA: Diagnosis not present

## 2018-02-20 DIAGNOSIS — M792 Neuralgia and neuritis, unspecified: Secondary | ICD-10-CM

## 2018-02-20 DIAGNOSIS — G5701 Lesion of sciatic nerve, right lower limb: Secondary | ICD-10-CM

## 2018-02-20 DIAGNOSIS — Z8739 Personal history of other diseases of the musculoskeletal system and connective tissue: Secondary | ICD-10-CM | POA: Diagnosis not present

## 2018-02-20 DIAGNOSIS — Z79899 Other long term (current) drug therapy: Secondary | ICD-10-CM | POA: Diagnosis not present

## 2018-02-20 DIAGNOSIS — G8929 Other chronic pain: Secondary | ICD-10-CM | POA: Diagnosis not present

## 2018-02-20 DIAGNOSIS — I10 Essential (primary) hypertension: Secondary | ICD-10-CM | POA: Diagnosis not present

## 2018-02-20 DIAGNOSIS — G4709 Other insomnia: Secondary | ICD-10-CM | POA: Diagnosis not present

## 2018-02-20 DIAGNOSIS — F1994 Other psychoactive substance use, unspecified with psychoactive substance-induced mood disorder: Secondary | ICD-10-CM | POA: Diagnosis not present

## 2018-02-20 MED ORDER — QUETIAPINE FUMARATE 50 MG PO TABS: 25.0000 mg | ORAL_TABLET | Freq: Every day | ORAL | 0 refills | Status: DC

## 2018-02-20 MED ORDER — GABAPENTIN 100 MG PO CAPS: 100.0000 mg | ORAL_CAPSULE | Freq: Three times a day (TID) | ORAL | 1 refills | Status: DC

## 2018-02-20 MED ORDER — DULOXETINE HCL 60 MG PO CPEP: 60.0000 mg | ORAL_CAPSULE | Freq: Every day | ORAL | 3 refills | Status: DC

## 2018-02-20 MED ORDER — AMITRIPTYLINE HCL 50 MG PO TABS: 50.0000 mg | ORAL_TABLET | Freq: Every day | ORAL | 1 refills | Status: DC

## 2018-02-20 NOTE — Progress Notes (Signed)
HPI: Bobby Shaw is a 42 y.o. male who  has a past medical history of Alcohol abuse, Chronic diarrhea, Depression with anxiety, Hypertension, LFT elevation, Lung collapse (2001), Murmur, and Seizure (HCC).  he presents to [facility] today, 02/20/18,  for chief complaint of: Hospital Follow-up  Hospitalized x3 days (02/11/18-02/14/18) for acute respiratory failure 2/2 PNA, presented to ED w/ AMS, intubated in ED, febrile and hypoxic, CT head neg, CT chest/abd (+)PNA RLL, LP attempted x4 w/o success. EEG 02/12/18 general slowing c/e global encephalopathy. Extubated fairly quickly, SaO2 drop on RA w/ ambulation so sent home on O2. Decreased Subutex, Seroquel, Neurontin. Continued Elavil and Cymbalta. MRI spine wo/w 02/12/18: (+)multilevel disease, see below.   SaO2 to 91%, he forgot his O2 today, reports some lightheadedness but overall okay. Still smoking. Denies drinking, drug use. UTox neg in ER.   From D/C summary:   NEW medications  levofloxacin (LEVAQUIN) 750 mg tablet Take one tablet (750 mg dose) by mouth daily for 7 days. Start date: 02/14/2018, End date: 02/21/2018   CHANGED medications  gabapentin (NEURONTIN) 100 mg capsule Take one capsule (100 mg dose) by mouth 3 (three) times a day as needed (BLE pain, chronic pain). Start date: 02/14/2018  QUEtiapine fumarate (SEROQUEL) 25 mg tablet Take one tablet (25 mg dose) by mouth at bedtime as needed (sleep). Start date: 02/14/2018   CONTINUED medications  amitriptyline HCl (ELAVIL) 50 mg tablet Take one tablet (50 mg total) by mouth at bedtime.  B Complex-Biotin-FA (SUPER B-50 B COMPLEX) CAPS Take 1 capsule by mouth daily.  buprenorphine-naloxone (SUBOXONE) 8-2 MG FILM SL film Place 1 tablet under the tongue 2 (two) times daily.  Cholecalciferol 4000 units TABS Take 2 tablets by mouth daily.  DULoxetine HCl (CYMBALTA) 60 mg capsule Take two capsules (120 mg total) by mouth daily.  losartan potassium  (COZAAR) 50 mg tablet Take 100 mg by mouth daily.     MRI: "IMPRESSION: 1. 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 Superimposed multilevel degenerative spondylosis, as well as epidural lipomatosis. 2. Multifactorial (above) severe thecal sac stenosis at L3-4, and moderately severe thecal sac stenosis at L4-5. 3. Mild edema in the left dorsal paraspinal muscles and the lower lumbar region (best appreciated on the sagittal and coronal T2-STIR sequences; series 3 and 4) is nonspecific, and may be secondary to the lumbar puncture performed yesterday. Please note, however, that infection (cellulitis and/or myositis) cannot be excluded by imaging. No focal fluid collection to suggest an abscess. 4. Subtle T2-hyperintensity in the lower conus medullaris cannot be excluded. 5. No definite abnormal intramedullary or leptomeningeal enhancement, allowing for moderate patient motion artifact. 6. Apparent moderate atrophy and scarring involving the left lower dorsal paraspinal muscles at the L5-S1 level. Electronically Signed by: [place] Holder"   Patient is accompanied by mom who assists with history-taking.   Past medical, surgical, social and family history reviewed:  Patient Active Problem List   Diagnosis Date Noted  . Umbilical hernia without obstruction and without gangrene 01/22/2018  . 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 Hypertriglyceridemia 11/25/2017  . Alkaline phosphatase elevation 11/25/2017  . Iron deficiency anemia 11/25/2017  . Substance induced mood disorder (HCC) 11/25/2017  . Chronic neuropathic pain 11/25/2017  . Coronary arteriosclerosis due to lipid rich plaque 08/21/2017  . Lumbar spinal stenosis 08/13/2017  . Pulmonary nodule 05/22/2017  . Biceps tendinitis of left shoulder 03/20/2017  . Seizure due to alcohol withdrawal (HCC) 03/16/2017  .  Gait difficulty 03/08/2017  . Tobacco abuse 03/08/2017  . Chronic diastolic congestive heart  failure (HCC) 02/23/2017  . Drug use 02/23/2017  . Alcohol use disorder, severe, dependence (HCC) 02/22/2017  . 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 Bipolar disorder (HCC) 10/15/2016  . Right leg swelling 08/22/2016  . Sciatic neuropathy, right 08/16/2016  . Polysubstance abuse (HCC) 07/07/2016  . ANXIETY 02/13/2010  . Hypertension goal BP (blood pressure) < 130/80 02/13/2010  . WRIST PAIN, LEFT 02/13/2010    Past Surgical History:  Procedure Laterality Date  . FOOT SURGERY      Social History   Tobacco Use  . Smoking status: Current Every Day Smoker    Packs/day: 1.00    Years: 20.00    Pack years: 20.00    Types: Cigarettes  . Smokeless tobacco: Never Used  Substance Use Topics  . Alcohol use: Yes    Comment: per chart, former alcohol abuse    Family History  Problem Relation Age of Onset  . Hypertension Father      Current medication list and allergy/intolerance information reviewed    Review of Systems:  Constitutional:  No  fever, no chills  HEENT: No  headache, no vision change  Cardiac: No  chest pain, No  pressure, No palpitations, No  Orthopnea  Respiratory:  No  shortness of breath. +chronic smokers cough  Gastrointestinal: No  abdominal pain, No  nausea, No  vomiting,  Musculoskeletal: No new myalgia/arthralgia  Skin: No  Rash,  Neurologic: No  weakness, No  dizzines  Psychiatric: No  concerns with depression, No  concerns with anxiety, No sleep problems, No mood problems  Exam:  BP 100/87 (BP Location: Left Arm, Patient Position: Sitting, Cuff Size: Normal)   Pulse (!) 102   Temp 97.9 F (36.6 C) (Oral)   Wt 243 lb 11.2 oz (110.5 kg)   SpO2 91%   BMI 35.99 kg/m   Constitutional: VS see above. General Appearance: alert, well-developed, well-nourished, NAD  Eyes: Normal lids and conjunctive, non-icteric sclera  Ears, Nose, Mouth, Throat: MMM, Normal external inspection  ears/nares/mouth/lips/gums.   Neck: No masses, trachea midline.   Respiratory: Normal respiratory effort. no wheeze, no rhonchi, no rales  Cardiovascular: S1/S2 normal, no murmur, no rub/gallop auscultated. RRR. No lower extremity edema.  Gastrointestinal: Nontender, no masses.   Neurological: Normal balance/coordination. No tremor.    Skin: warm, dry, intact.  Psychiatric: Normal judgment/insight. Normal mood and affect. Oriented x3.      ASSESSMENT/PLAN:   Chronic neuropathic pain - omplicated by history of substance abuse, respiratory depression likely related to polypharmacy complicating pneumonia - Plan: gabapentin (NEURONTIN) 100 MG capsule  Substance induced mood disorder (HCC) - Plan: QUEtiapine (SEROQUEL) 50 MG tablet  Other insomnia - Plan: QUEtiapine (SEROQUEL) 50 MG tablet  Hypertension goal BP (blood pressure) < 130/80  Sciatic neuropathy, right - Plan: DULoxetine (CYMBALTA) 60 MG capsule  History of gout - Plan: Uric acid  Spinal stenosis of lumbar region with neurogenic claudication - Plan: Ambulatory referral to Neurosurgery  Polypharmacy - discussed risks of multiple partially sedating medications with potential for CNS/respiratory depression. lower doses much as possible      Visit summary with medication list and pertinent instructions was printed for patient to review. All questions at time of visit were answered - patient instructed to contact office with any additional concerns. ER/RTC precautions were reviewed with the patient.   Follow-up plan: Return for recheck 4-6 weeks, sooner if needed .  Please note: voice recognition software was used to produce this document, and typos may escape review. Please contact Dr. Sheppard Coil for any needed clarifications.

## 2018-02-22 ENCOUNTER — Telehealth: Payer: Self-pay | Admitting: Osteopathic Medicine

## 2018-02-22 NOTE — Telephone Encounter (Signed)
Safety question regarding Seroquel. Called [phone] Auth number [id number]

## 2018-03-15 ENCOUNTER — Ambulatory Visit: Payer: Medicaid Other | Admitting: Osteopathic Medicine

## 2018-03-19 ENCOUNTER — Encounter: Payer: Self-pay | Admitting: Osteopathic Medicine

## 2018-03-19 ENCOUNTER — Ambulatory Visit (INDEPENDENT_AMBULATORY_CARE_PROVIDER_SITE_OTHER): Payer: Medicaid Other | Admitting: Osteopathic Medicine

## 2018-03-19 VITALS — BP 116/71 | HR 80 | Temp 97.7°F | Wt 236.1 lb

## 2018-03-19 DIAGNOSIS — G8929 Other chronic pain: Secondary | ICD-10-CM

## 2018-03-19 DIAGNOSIS — G4709 Other insomnia: Secondary | ICD-10-CM

## 2018-03-19 DIAGNOSIS — F1994 Other psychoactive substance use, unspecified with psychoactive substance-induced mood disorder: Secondary | ICD-10-CM | POA: Diagnosis not present

## 2018-03-19 DIAGNOSIS — M792 Neuralgia and neuritis, unspecified: Secondary | ICD-10-CM | POA: Diagnosis not present

## 2018-03-19 DIAGNOSIS — L0211 Cutaneous abscess of neck: Secondary | ICD-10-CM | POA: Diagnosis not present

## 2018-03-19 MED ORDER — GABAPENTIN 100 MG PO CAPS: ORAL_CAPSULE | ORAL | 0 refills | Status: DC

## 2018-03-19 MED ORDER — QUETIAPINE FUMARATE 50 MG PO TABS: 50.0000 mg | ORAL_TABLET | Freq: Every day | ORAL | 0 refills | Status: DC

## 2018-03-19 MED ORDER — DOXYCYCLINE HYCLATE 100 MG PO TABS: 100.0000 mg | ORAL_TABLET | Freq: Two times a day (BID) | ORAL | 0 refills | Status: DC

## 2018-03-19 NOTE — Patient Instructions (Addendum)
Decent job draining your own abscess, but in the future, come see me for this stuff! Let's do some antibiotics just to be safe, and let's recheck that wound in another 2-3 days.   For pain:  Suboxone as-is twice daily    Cymbalta as-is daily   Gabapentin 400 mg caps + one or two 100 mg caps up to 600 mg three times per day total  Seroquel ok to go to 50 mg consistently   Amitriptyline let's stay at 50 mg at bedtime consistently

## 2018-03-19 NOTE — Progress Notes (Signed)
HPI: Bobby Shaw is a 42 y.o. male who  has a past medical history of Alcohol abuse, Chronic diarrhea, Depression with anxiety, Hypertension, LFT elevation, Lung collapse (2001), Murmur, and Seizure (HCC).  he [facility] to [facility] today, 03/19/18,  for chief complaint of:  Cyst?  Neck cyst, was size of a pea then yesterday was size of a golf ball. He punctured it yesterday w/ scissors at home and got a bunch of gunk out.   Reports he has been taking older Gabapentin 400 mg tid and this is helping only a little. Would like to increase this. He's taking Seroquel 50 mg nightly to help sleep. He's taking Elavil "as needed for anxiety."    Past medical history, surgical history, and family history reviewed.  Current medication list and allergy/intolerance information reviewed.   (See remainder of HPI, ROS, Phys Exam below)   ASSESSMENT/PLAN:   Abscess, neck - no fluctuance, (+)induration, serious drainage from puncture site on neck, not neough to cx. Advised DO NOT self-treat this. Abx to cover MRSA  Substance induced mood disorder (HCC) - Plan: QUEtiapine (SEROQUEL) 50 MG tablet  Other insomnia - Plan: QUEtiapine (SEROQUEL) 50 MG tablet  Chronic neuropathic pain - complicated by history of substance abuse, respiratory depression likely related to polypharmacy - risks/benefits of meds and QOL discussed  - Plan: gabapentin (NEURONTIN) 100 MG capsule   Meds ordered this encounter  Medications  . QUEtiapine (SEROQUEL) 50 MG tablet    Sig: Take 1 tablet (50 mg total) by mouth at bedtime.    Dispense:  90 tablet    Refill:  0  . gabapentin (NEURONTIN) 100 MG capsule    Sig: Take 1-2 of these 100 mg capsules with 1 of the 400 mg capsules: total of 500-600 mg three times per day    Dispense:  180 capsule    Refill:  0  . doxycycline (VIBRA-TABS) 100 MG tablet    Sig: Take 1 tablet (100 mg total) by mouth 2 (two) times daily.    Dispense:  14  tablet    Refill:  0    Patient Instructions  Decent job draining your own abscess, but in the future, come see me for this stuff! Let's do some antibiotics just to be safe, and let's recheck that wound in another 2-3 days.   For pain:  Suboxone as-is twice daily    Cymbalta as-is daily   Gabapentin 400 mg caps + one or two 100 mg caps up to 600 mg three times per day total  Seroquel ok to go to 50 mg consistently   Amitriptyline let's stay at 50 mg at bedtime consistently        Follow-up plan: Return for recheck wound in 2-3 days (FYI for scheduler - can double-book) .     ############################################ ############################################ ############################################ ############################################    Outpatient Encounter Medications as of 03/19/2018  Medication Sig Note  . amitriptyline (ELAVIL) 50 MG tablet Take 1 tablet (50 mg total) by mouth at bedtime.   Marland Kitchen. atorvastatin (LIPITOR) 20 MG tablet Take 1 tablet (20 mg total) by mouth daily.   Marland Kitchen. b complex vitamins capsule Take 1 capsule by mouth daily.   . B Complex-Biotin-FA (SUPER B-50 B COMPLEX) CAPS Take by mouth.   . Buprenorphine HCl-Naloxone HCl (SUBOXONE SL) Place 8 mg under the tongue. 12/13/2017: As per pt - taking 16 mg   . cholecalciferol (VITAMIN D) 1000 units tablet Take 1,000 Units by  mouth daily.   . DULoxetine (CYMBALTA) 60 MG capsule Take 1 capsule (60 mg total) by mouth daily.   . ferrous sulfate 325 (65 FE) MG EC tablet Take 1 tablet (325 mg total) by mouth 3 (three) times daily with meals.   . gabapentin (NEURONTIN) 100 MG capsule Take 1 capsule (100 mg total) by mouth 3 (three) times daily. As needed for nerve pain. Can take extra 100 mg if needed   . ibuprofen (ADVIL,MOTRIN) 800 MG tablet Take 1 tablet (800 mg total) by mouth every 8 (eight) hours as needed.   Marland Kitchen losartan (COZAAR) 100 MG tablet Take 1 tablet (100 mg total) by mouth daily.   .  QUEtiapine (SEROQUEL) 50 MG tablet Take 0.5-1 tablets (25-50 mg total) by mouth at bedtime.   . sildenafil (REVATIO) 20 MG tablet Take 1-4 tablets (20-80 mg total) by mouth as needed.    No facility-administered encounter medications on file as of 03/19/2018.    No Known Allergies    Review of Systems:  Constitutional: No recent illness  HEENT: No  headache, no vision change  Cardiac: No  chest pain, No  pressure, No palpitations  Respiratory:  No  shortness of breath.  Gastrointestinal: No  abdominal pain, no change on bowel habits  Musculoskeletal: No new myalgia/arthralgia  Skin: +Rash  Neurologic: No  weakness, No  Dizziness  Psychiatric: No  concerns with depression, No  concerns with anxiety  Exam:  BP 116/71 (BP Location: Left Arm, Patient Position: Sitting, Cuff Size: Normal)   Pulse 80   Temp 97.7 F (36.5 C) (Oral)   Wt 236 lb 1.6 oz (107.1 kg)   BMI 34.87 kg/m   Constitutional: VS see above. General Appearance: alert, well-developed, well-nourished, NAD  Eyes: Normal lids and conjunctive, non-icteric sclera  Ears, Nose, Mouth, Throat: MMM, Normal external inspection ears/nares/mouth/lips/gums.  Neck: L side of neck (+)erythema w/ maculopapular rash c/w adhesive bandaid reaction, small incision centrally, no fluctuance, subq edema   Respiratory: Normal respiratory effort. no wheeze, no rhonchi, no rales  Cardiovascular: S1/S2 normal, no murmur, no rub/gallop auscultated. RRR.   Musculoskeletal: Gait normal. Symmetric and independent movement of all extremities  Neurological: Normal balance/coordination. No tremor.  Skin: warm, dry, intact.   Psychiatric: Normal judgment/insight. Normal mood and affect. Oriented x3.   Visit summary with medication list and pertinent instructions was printed for patient to review, advised to alert [place] if any changes needed. All questions at time of visit were answered - patient instructed to contact office with any  additional concerns. ER/RTC precautions were reviewed with the patient and understanding verbalized.   Follow-up plan: Return for recheck wound in 2-3 days (FYI for scheduler - can double-book) .  Note: Total time spent 25 minutes, greater than 50% of the visit was spent face-to-face counseling and coordinating care for the following: The primary encounter diagnosis was Abscess, neck. Diagnoses of Substance induced mood disorder (HCC), Other insomnia, and Chronic neuropathic pain were also pertinent to this visit.Marland Kitchen  Please note: voice recognition software was used to produce this document, and typos may escape review. Please contact Dr. Lyn Hollingshead for any needed clarifications.

## 2018-03-21 ENCOUNTER — Telehealth: Payer: Self-pay | Admitting: Osteopathic Medicine

## 2018-03-21 ENCOUNTER — Ambulatory Visit (INDEPENDENT_AMBULATORY_CARE_PROVIDER_SITE_OTHER): Payer: Medicaid Other | Admitting: Osteopathic Medicine

## 2018-03-21 ENCOUNTER — Encounter: Payer: Self-pay | Admitting: Osteopathic Medicine

## 2018-03-21 VITALS — BP 140/75 | HR 84 | Temp 98.4°F

## 2018-03-21 DIAGNOSIS — L03221 Cellulitis of neck: Secondary | ICD-10-CM

## 2018-03-21 DIAGNOSIS — L0211 Cutaneous abscess of neck: Secondary | ICD-10-CM

## 2018-03-21 NOTE — Telephone Encounter (Signed)
Pt advised.

## 2018-03-21 NOTE — Telephone Encounter (Signed)
I sent refills to [facility] a while ago, he should have everything there already

## 2018-03-21 NOTE — Telephone Encounter (Signed)
Pt called and is worried that he will not make it to pharmacy before they close after his appt. This afternoon. He was wondering if script for  The Amitrypolene and the serequil could be called in this morning.

## 2018-03-21 NOTE — Progress Notes (Signed)
HPI: Bobby Shaw is a 42 y.o. male who  has a past medical history of Alcohol abuse, Chronic diarrhea, Depression with anxiety, Hypertension, LFT elevation, Lung collapse (2001), Murmur, and Seizure (HCC).  he presents to [facility] today, 03/21/18,  for chief complaint of:  Skin recheck  Neck still tender, redness is better. Hasn't been using warm compresses.    Past medical history, surgical history, and family history reviewed.  Current medication list and allergy/intolerance information reviewed.   (See remainder of HPI, ROS, Phys Exam below)    ASSESSMENT/PLAN:   Cellulitis and abscess of neck - appears improving, no fluctuance or severe tenderness, no drainage - finish abx, if worse see [place] or UC for drainage, use compresses!     Follow-up plan: Return in about 1 month (around 04/18/2018) for recheck on meds .     ############################################ ############################################ ############################################ ############################################    Outpatient Encounter Medications as of 03/21/2018  Medication Sig Note  . amitriptyline (ELAVIL) 50 MG tablet Take 1 tablet (50 mg total) by mouth at bedtime.   Marland Kitchen atorvastatin (LIPITOR) 20 MG tablet Take 1 tablet (20 mg total) by mouth daily.   Marland Kitchen b complex vitamins capsule Take 1 capsule by mouth daily.   . B Complex-Biotin-FA (SUPER B-50 B COMPLEX) CAPS Take by mouth.   . Buprenorphine HCl-Naloxone HCl (SUBOXONE SL) Place 8 mg under the tongue 2 (two) times daily.  12/13/2017: As per pt - taking 16 mg   . cholecalciferol (VITAMIN D) 1000 units tablet Take 1,000 Units by mouth daily.   Marland Kitchen doxycycline (VIBRA-TABS) 100 MG tablet Take 1 tablet (100 mg total) by mouth 2 (two) times daily.   . DULoxetine (CYMBALTA) 60 MG capsule Take 1 capsule (60 mg total) by mouth daily.   . ferrous sulfate 325 (65 FE) MG EC tablet Take 1 tablet (325 mg total)  by mouth 3 (three) times daily with meals.   . gabapentin (NEURONTIN) 100 MG capsule Take 1-2 of these 100 mg capsules with 1 of the 400 mg capsules: total of 500-600 mg three times per day   . ibuprofen (ADVIL,MOTRIN) 800 MG tablet Take 1 tablet (800 mg total) by mouth every 8 (eight) hours as needed.   Marland Kitchen losartan (COZAAR) 100 MG tablet Take 1 tablet (100 mg total) by mouth daily.   . QUEtiapine (SEROQUEL) 50 MG tablet Take 1 tablet (50 mg total) by mouth at bedtime.    No facility-administered encounter medications on file as of 03/21/2018.    No Known Allergies    Review of Systems:  Constitutional: No recent illness  HEENT: No  headache, no vision change  Respiratory:  No  shortness of breath  Skin: No  Worsening or new Rash   Exam:  BP 140/75 (BP Location: Left Arm, Patient Position: Sitting, Cuff Size: Large)   Pulse 84   Temp 98.4 F (36.9 C) (Oral)   SpO2 97%   Constitutional: VS see above. General Appearance: alert, well-developed, well-nourished, NAD  Neck: No masses, trachea midline.   Skin: warm, dry, scabbed area on L neck where he'd punctured abscess at home, (+)subq edema/induration w/o fluctuance, no significant erythema.   Psychiatric: Normal judgment/insight. Normal mood and affect. Oriented x3.   Visit summary with medication list and pertinent instructions was printed for patient to review, advised to alert [place] if any changes needed. All questions at time of visit were answered - patient instructed to contact office with any additional concerns.  ER/RTC precautions were reviewed with the patient and understanding verbalized.   Follow-up plan: Return in about 1 month (around 04/18/2018) for recheck on meds .  Note: Total time spent 10 minutes, greater than 50% of the visit was spent face-to-face counseling and coordinating care for the following: The encounter diagnosis was Cellulitis and abscess of neck..  Please note: voice recognition software was used to  produce this document, and typos may escape review. Please contact Dr. Lyn HollingsheadAlexander for any needed clarifications.

## 2018-03-21 NOTE — Telephone Encounter (Signed)
Routing to PCP for review.

## 2018-03-27 ENCOUNTER — Telehealth: Payer: Self-pay

## 2018-03-27 LAB — URIC ACID: URIC ACID, SERUM: 7.1 mg/dL (ref 4.0–8.0)

## 2018-03-27 MED ORDER — INDOMETHACIN 50 MG PO CAPS: ORAL_CAPSULE | ORAL | 1 refills | Status: DC

## 2018-03-27 NOTE — Telephone Encounter (Signed)
Pt called stating he is having very bad gout flare ups. Pt currently uncomfortable and in pain. Requesting provider to send a rx to local pharmacy. Pls advise, thanks.

## 2018-03-27 NOTE — Telephone Encounter (Signed)
Indomethacin anti-inflammaotry sent to pharmacy on file

## 2018-03-27 NOTE — Telephone Encounter (Signed)
Left vm msg for pt rx sent to local pharmacy. Call back information provided.

## 2018-03-29 ENCOUNTER — Other Ambulatory Visit: Payer: Self-pay | Admitting: Osteopathic Medicine

## 2018-03-29 MED ORDER — COLCHICINE 0.6 MG PO TABS
0.6000 mg | ORAL_TABLET | Freq: Every day | ORAL | 0 refills | Status: DC
Start: 2018-03-29 — End: 2018-04-01

## 2018-03-29 MED ORDER — ALLOPURINOL 100 MG PO TABS: ORAL_TABLET | ORAL | 0 refills | Status: DC

## 2018-03-29 NOTE — Progress Notes (Signed)
Gout ppx begun

## 2018-03-29 NOTE — Telephone Encounter (Signed)
Pt has been updated.  

## 2018-04-01 ENCOUNTER — Other Ambulatory Visit: Payer: Self-pay

## 2018-04-01 MED ORDER — COLCHICINE 0.6 MG PO CAPS: 0.6000 mg | ORAL_CAPSULE | Freq: Every day | ORAL | 0 refills | Status: DC

## 2018-04-04 ENCOUNTER — Ambulatory Visit: Payer: Medicaid Other | Admitting: Osteopathic Medicine

## 2018-04-17 ENCOUNTER — Other Ambulatory Visit: Payer: Self-pay | Admitting: Osteopathic Medicine

## 2018-04-17 DIAGNOSIS — M792 Neuralgia and neuritis, unspecified: Principal | ICD-10-CM

## 2018-04-17 DIAGNOSIS — G8929 Other chronic pain: Secondary | ICD-10-CM

## 2018-04-18 ENCOUNTER — Telehealth: Payer: Self-pay

## 2018-04-18 DIAGNOSIS — M48061 Spinal stenosis, lumbar region without neurogenic claudication: Secondary | ICD-10-CM

## 2018-04-18 NOTE — Telephone Encounter (Signed)
Molli HazardMatthew called stating he has not [facility]. I didn't see any mention or an order in the chart. Please advise.

## 2018-04-18 NOTE — Telephone Encounter (Signed)
Ordering a repeat right L3-L4 interlaminar epidural, please contact [facility] for scheduling.

## 2018-04-18 NOTE — Telephone Encounter (Signed)
Roberta advised.

## 2018-04-22 ENCOUNTER — Ambulatory Visit (INDEPENDENT_AMBULATORY_CARE_PROVIDER_SITE_OTHER): Payer: Medicaid Other | Admitting: Osteopathic Medicine

## 2018-04-22 ENCOUNTER — Encounter: Payer: Self-pay | Admitting: Osteopathic Medicine

## 2018-04-22 VITALS — BP 138/91 | HR 88 | Temp 98.6°F | Wt 240.9 lb

## 2018-04-22 DIAGNOSIS — G4709 Other insomnia: Secondary | ICD-10-CM

## 2018-04-22 DIAGNOSIS — M48062 Spinal stenosis, lumbar region with neurogenic claudication: Secondary | ICD-10-CM | POA: Diagnosis not present

## 2018-04-22 DIAGNOSIS — M792 Neuralgia and neuritis, unspecified: Secondary | ICD-10-CM

## 2018-04-22 DIAGNOSIS — G8929 Other chronic pain: Secondary | ICD-10-CM

## 2018-04-22 DIAGNOSIS — Z8659 Personal history of other mental and behavioral disorders: Secondary | ICD-10-CM | POA: Diagnosis not present

## 2018-04-22 MED ORDER — AMITRIPTYLINE HCL 50 MG PO TABS: 50.0000 mg | ORAL_TABLET | Freq: Every day | ORAL | 0 refills | Status: DC

## 2018-04-22 MED ORDER — GABAPENTIN 400 MG PO CAPS: 800.0000 mg | ORAL_CAPSULE | Freq: Three times a day (TID) | ORAL | 1 refills | Status: DC

## 2018-04-22 MED ORDER — QUETIAPINE FUMARATE 50 MG PO TABS: 50.0000 mg | ORAL_TABLET | Freq: Every day | ORAL | 0 refills | Status: DC

## 2018-04-22 NOTE — Progress Notes (Signed)
HPI: Bobby Shaw is a 42 y.o. male who  has a past medical history of Alcohol abuse, Chronic diarrhea, Depression with anxiety, Hypertension, LFT elevation, Lung collapse (2001), Murmur, and Seizure (HCC).  he presents to [facility] today, 04/22/18,  for chief complaint of:  Medication management Gout - questions about meds Sleep and pain - would like to go up on Elavil   Right lower extremity pain has definitely been bothering him a bit more than usual lately.  Describes it as a tightening/nerve shooting pain.  We have increased the gabapentin in hopes of dealing with this a bit better, it is only helping minimally.  He was never able to follow-up with neurosurgery, referral was placed a while ago, Dr. Karie Schwalbe had ordered MRI which did show some significant stenosis in the lumbar spine which may be contributing to symptoms.  Patient inquires about gout medications, whether he really needs to be on 2 medicines.  Sleep is still an issue for him.  He states that he is doing a lot better with the Seroquel.  He occasionally will take Elavil as needed for anxiety rather than every night, he states that this really helps him when he is feeling a bit on edge, he is only taking 1 pill.  He does have a history of accidental overdose with this medicine.  He would like to consider an alternate pain management clinic since his previous one closed.  He would like a second opinion on whether something like an implanted spinal stimulator might be of any benefit to him.  He is also hopeful that he may be able to transition from Suboxone to something that works a little better   Past medical history, surgical history, and family history reviewed.  Current medication list and allergy/intolerance information reviewed.   (See remainder of HPI, ROS, Phys Exam below)    ASSESSMENT/PLAN: I am okay to increase the gabapentin and see if a higher dose helps, I am cautious about  going up on any other medications given his history of accidental overdose, though admittedly he has not had overdose on his opiate medications.  He seems to be trying to alleviate his insomnia/pain issues with his other medicines and has had hospitalizations due to respiratory depression from this issue.  Patient has been extensively educated on risks of polypharmacy and my concerns about the medication increase.  Ideally would like to get his pain under control, I think consultation with neurosurgery to see if any procedure may be of benefit  Spinal stenosis of lumbar region with neurogenic claudication  Chronic neuropathic pain - complicated by history of substance abuse, respiratory depression related to polypharmacy - risks/benefits of meds and QOL discussed  - Plan: gabapentin (NEURONTIN) 400 MG capsule  History of mood disorder - Plan: QUEtiapine (SEROQUEL) 50 MG tablet  Other insomnia - Plan: QUEtiapine (SEROQUEL) 50 MG tablet   Meds ordered this encounter  Medications  . gabapentin (NEURONTIN) 400 MG capsule    Sig: Take 2 capsules (800 mg total) by mouth 3 (three) times daily.    Dispense:  180 capsule    Refill:  1    Please consider 90 day supplies to promote better adherence  . QUEtiapine (SEROQUEL) 50 MG tablet    Sig: Take 1 tablet (50 mg total) by mouth at bedtime.    Dispense:  90 tablet    Refill:  0  . amitriptyline (ELAVIL) 50 MG tablet    Sig: Take 1  tablet (50 mg total) by mouth at bedtime.    Dispense:  90 tablet    Refill:  0    Patient Instructions  [facility] at [phone]    Follow-up plan: Return in about 1 month (around 05/20/2018) for recheck meds.     ############################################ ############################################ ############################################ ############################################    Outpatient Encounter Medications as of 04/22/2018  Medication Sig Note  . allopurinol (ZYLOPRIM) 100  MG tablet Take 1 tablet (100 mg total) by mouth daily for 30 days, THEN 2 tablets (200 mg total) daily.   Marland Kitchen. amitriptyline (ELAVIL) 50 MG tablet Take 1 tablet (50 mg total) by mouth at bedtime.   Marland Kitchen. atorvastatin (LIPITOR) 20 MG tablet Take 1 tablet (20 mg total) by mouth daily.   Marland Kitchen. b complex vitamins capsule Take 1 capsule by mouth daily.   . B Complex-Biotin-FA (SUPER B-50 B COMPLEX) CAPS Take by mouth.   . Buprenorphine HCl-Naloxone HCl (SUBOXONE SL) Place 8 mg under the tongue 2 (two) times daily.  12/13/2017: As per pt - taking 16 mg   . cholecalciferol (VITAMIN D) 1000 units tablet Take 1,000 Units by mouth daily.   . Colchicine (MITIGARE) 0.6 MG CAPS Take 0.6 mg by mouth daily.   Marland Kitchen. doxycycline (VIBRA-TABS) 100 MG tablet Take 1 tablet (100 mg total) by mouth 2 (two) times daily.   . DULoxetine (CYMBALTA) 60 MG capsule Take 1 capsule (60 mg total) by mouth daily.   Marland Kitchen. gabapentin (NEURONTIN) 100 MG capsule TAKE 1 TO 2 CAPSULES BY MOUTH THREE TIMES DAILY TAKE  WITH  400MG   CAPSULE  FOR  TOTAL  OF  500-600 04/22/2018: As per pt - taking 120 mg 3x times daily  . ibuprofen (ADVIL,MOTRIN) 800 MG tablet Take 1 tablet (800 mg total) by mouth every 8 (eight) hours as needed.   . indomethacin (INDOCIN) 50 MG capsule TAKE ONE CAPSULE PO 3 TIMES PER DAY WHILE PAIN LASTS, THEN DECREASE TO 1 - 2 TIMES PER DAY AS NEEDED   . losartan (COZAAR) 100 MG tablet Take 1 tablet (100 mg total) by mouth daily.   . QUEtiapine (SEROQUEL) 50 MG tablet Take 1 tablet (50 mg total) by mouth at bedtime.   . ferrous sulfate 325 (65 FE) MG EC tablet Take 1 tablet (325 mg total) by mouth 3 (three) times daily with meals. (Patient not taking: Reported on 04/22/2018)    No facility-administered encounter medications on file as of 04/22/2018.    No Known Allergies    Review of Systems:  Constitutional: No recent illness  HEENT: No  headache, no vision change  Cardiac: No  chest pain, No  pressure, No palpitations  Respiratory:   No  shortness of breath. No  Cough  Gastrointestinal: No  abdominal pain, no change on bowel habits  Musculoskeletal: +new myalgia/arthralgia  Skin: No  Rash  Neurologic: No  weakness, No  Dizziness  Psychiatric: No  concerns with depression, +concerns with anxiety  Exam:  BP (!) 138/91 (BP Location: Left Arm, Patient Position: Sitting, Cuff Size: Normal)   Pulse 88   Temp 98.6 F (37 C) (Oral)   Wt 240 lb 14.4 oz (109.3 kg)   BMI 35.57 kg/m   Constitutional: VS see above. General Appearance: alert, well-developed, well-nourished, NAD  Eyes: Normal lids and conjunctive, non-icteric sclera  Ears, Nose, Mouth, Throat: MMM, Normal external inspection ears/nares/mouth/lips/gums.  Neck: No masses, trachea midline.   Respiratory: Normal respiratory effort. no wheeze, no rhonchi, no rales  Cardiovascular: S1/S2  normal, no murmur, no rub/gallop auscultated. RRR.   Neurological: Normal balance/coordination. No tremor.  Skin: warm, dry, intact.   Psychiatric: Normal judgment/insight. Normal mood and affect. Oriented x3.   Visit summary with medication list and pertinent instructions was printed for patient to review, advised to alert [place] if any changes needed. All questions at time of visit were answered - patient instructed to contact office with any additional concerns. ER/RTC precautions were reviewed with the patient and understanding verbalized.   Follow-up plan: Return in about 1 month (around 05/20/2018) for recheck meds.  Note: Total time spent 25 minutes, greater than 50% of the visit was spent face-to-face counseling and coordinating care for the following: The primary encounter diagnosis was Spinal stenosis of lumbar region with neurogenic claudication. Diagnoses of Chronic neuropathic pain, History of mood disorder, and Other insomnia were also pertinent to this visit.Marland Kitchen  Please note: voice recognition software was used to produce this document, and typos may escape  review. Please contact Dr. Lyn Hollingshead for any needed clarifications.

## 2018-04-22 NOTE — Patient Instructions (Addendum)
[facility] at [phone]

## 2018-04-23 ENCOUNTER — Encounter: Payer: Self-pay | Admitting: Osteopathic Medicine

## 2018-04-24 ENCOUNTER — Other Ambulatory Visit: Payer: Self-pay | Admitting: Osteopathic Medicine

## 2018-04-24 DIAGNOSIS — G8929 Other chronic pain: Secondary | ICD-10-CM

## 2018-04-24 DIAGNOSIS — M792 Neuralgia and neuritis, unspecified: Principal | ICD-10-CM

## 2018-05-01 ENCOUNTER — Ambulatory Visit
Admission: RE | Admit: 2018-05-01 | Discharge: 2018-05-01 | Disposition: A | Discharge status: Home / Self Care | Payer: Medicaid Other | Source: Ambulatory Visit | Attending: Sports Medicine | Admitting: Sports Medicine

## 2018-05-01 ENCOUNTER — Telehealth: Payer: Self-pay

## 2018-05-01 DIAGNOSIS — G5701 Lesion of sciatic nerve, right lower limb: Secondary | ICD-10-CM

## 2018-05-01 MED ORDER — METHYLPREDNISOLONE ACETATE 40 MG/ML INJ SUSP (RADIOLOG
120.0000 mg | Freq: Once | INTRAMUSCULAR | Status: AC
  Administered 2018-05-01: 120 mg via EPIDURAL

## 2018-05-01 MED ORDER — IOPAMIDOL (ISOVUE-M 200) INJECTION 41%
1.0000 mL | Freq: Once | INTRAMUSCULAR | Status: AC
  Administered 2018-05-01: 1 mL via EPIDURAL

## 2018-05-01 NOTE — Discharge Instructions (Signed)

## 2018-05-01 NOTE — Telephone Encounter (Signed)
Pt called stating he has been out of his depression medication. He has constant "buzzing noise" coming from his head. Feels he's going through "it". I called the pharmacy, pt has refill available on his meds, but it's too soon to be filled (September). Pls advise, thanks.

## 2018-05-02 NOTE — Telephone Encounter (Signed)
Called and spoke with Pt's mother. She advised Pt was able to get the Cymbalta Rx but unable to get the amitriptyline. Mother states he was taking this more than he was supposed, and reports PCP is aware. They are holding that rx until fill date in Sept.

## 2018-05-02 NOTE — Telephone Encounter (Signed)
If he is talking about the Cymbalta, that is fine for him to go ahead and fill it early.  Need to reiterate to him that it is 1 pill daily, if he is running out early there may be some confusion with how he is taking his medicines.  Can call the pharmacy and let them know that it is fine to fill early, if they need a prescription for me, I can take care of it

## 2018-05-03 NOTE — Telephone Encounter (Signed)
Patient and I have discussed the importance of taking medicines as directed.  He does have a history of accidental overdose on amitriptyline.

## 2018-05-20 ENCOUNTER — Ambulatory Visit: Payer: Medicaid Other | Admitting: Osteopathic Medicine

## 2018-05-29 ENCOUNTER — Encounter: Payer: Self-pay | Admitting: Osteopathic Medicine

## 2018-05-29 ENCOUNTER — Ambulatory Visit (INDEPENDENT_AMBULATORY_CARE_PROVIDER_SITE_OTHER): Payer: Medicaid Other | Admitting: Osteopathic Medicine

## 2018-05-29 VITALS — BP 139/72 | HR 79 | Temp 98.0°F | Wt 239.9 lb

## 2018-05-29 DIAGNOSIS — F419 Anxiety disorder, unspecified: Secondary | ICD-10-CM

## 2018-05-29 DIAGNOSIS — M792 Neuralgia and neuritis, unspecified: Secondary | ICD-10-CM

## 2018-05-29 DIAGNOSIS — G8929 Other chronic pain: Secondary | ICD-10-CM

## 2018-05-29 DIAGNOSIS — M109 Gout, unspecified: Secondary | ICD-10-CM | POA: Diagnosis not present

## 2018-05-29 DIAGNOSIS — M48062 Spinal stenosis, lumbar region with neurogenic claudication: Secondary | ICD-10-CM | POA: Diagnosis not present

## 2018-05-29 DIAGNOSIS — G5701 Lesion of sciatic nerve, right lower limb: Secondary | ICD-10-CM

## 2018-05-29 NOTE — Progress Notes (Signed)
HPI: Bobby Shaw is a 42 y.o. male who  has a past medical history of Alcohol abuse, Chronic diarrhea, Depression with anxiety, Hypertension, LFT elevation, Lung collapse (2001), Murmur, and Seizure (HCC).  he presents to [facility] today, 05/29/18,  for chief complaint of:  Medication follow-up, chronic pain  Patient had been apparently taking the amitriptyline "as needed" for anxiety, probably why he is out early.  He does have a history of accidental overdose on this, he took more than recommended dosage one night to try to help himself sleep and overdid it, went into respiratory failure.  So we have been cautious with medications.  He is also on Suboxone, he would like different pain management referral to discuss possible placement of spinal stimulator, he recently consulted with neurosurgery about this and was not felt to be a surgical candidate for fusion or other such procedure.    He states his anxiety is also starting to become an issue, he is already taking Cymbalta, he did not do well with Wellbutrin before.  Also currently on Seroquel, mostly for insomnia.  Requests referral for help with medication management for anxiety.  Gout, no additional flares, has been taking the allopurinol, due for uric acid recheck.    Past medical history, surgical history, and family history reviewed.  Current medication list and allergy/intolerance information reviewed.   (See remainder of HPI, ROS, Phys Exam below)    ASSESSMENT/PLAN: The primary encounter diagnosis was Gout, unspecified cause, unspecified chronicity, unspecified site. Diagnoses of Anxiety, Sciatic neuropathy, right, Spinal stenosis of lumbar region with neurogenic claudication, and Chronic neuropathic pain were also pertinent to this visit.  Orders Placed This Encounter  Procedures  . Uric acid  . Ambulatory referral to Behavioral Health    Follow-up plan: Return in about 3 months  (around 08/28/2018) for recheck on medicines, sooner if needed .                   ############################################ ############################################ ############################################ ############################################    Outpatient Encounter Medications as of 05/29/2018  Medication Sig Note  . allopurinol (ZYLOPRIM) 100 MG tablet Take 1 tablet (100 mg total) by mouth daily for 30 days, THEN 2 tablets (200 mg total) daily.   Marland Kitchen amitriptyline (ELAVIL) 50 MG tablet Take 1 tablet (50 mg total) by mouth at bedtime.   Marland Kitchen atorvastatin (LIPITOR) 20 MG tablet Take 1 tablet (20 mg total) by mouth daily.   Marland Kitchen b complex vitamins capsule Take 1 capsule by mouth daily.   . B Complex-Biotin-FA (SUPER B-50 B COMPLEX) CAPS Take by mouth.   . Buprenorphine HCl-Naloxone HCl (SUBOXONE SL) Place 8 mg under the tongue 2 (two) times daily.  12/13/2017: As per pt - taking 16 mg   . cholecalciferol (VITAMIN D) 1000 units tablet Take 1,000 Units by mouth daily.   . Colchicine (MITIGARE) 0.6 MG CAPS Take 0.6 mg by mouth daily.   Marland Kitchen doxycycline (VIBRA-TABS) 100 MG tablet Take 1 tablet (100 mg total) by mouth 2 (two) times daily.   . DULoxetine (CYMBALTA) 60 MG capsule Take 1 capsule (60 mg total) by mouth daily.   . indomethacin (INDOCIN) 50 MG capsule TAKE ONE CAPSULE PO 3 TIMES PER DAY WHILE PAIN LASTS, THEN DECREASE TO 1 - 2 TIMES PER DAY AS NEEDED   . losartan (COZAAR) 100 MG tablet Take 1 tablet (100 mg total) by mouth daily.   . QUEtiapine (SEROQUEL) 50 MG tablet Take 1 tablet (50 mg  total) by mouth at bedtime.   . ferrous sulfate 325 (65 FE) MG EC tablet Take 1 tablet (325 mg total) by mouth 3 (three) times daily with meals. (Patient not taking: Reported on 04/22/2018)   . gabapentin (NEURONTIN) 400 MG capsule Take 2 capsules (800 mg total) by mouth 3 (three) times daily.   Marland Kitchen ibuprofen (ADVIL,MOTRIN) 800 MG tablet Take 1 tablet (800 mg total) by mouth every 8  (eight) hours as needed. (Patient not taking: Reported on 05/29/2018)    No facility-administered encounter medications on file as of 05/29/2018.    No Known Allergies    Review of Systems:  Constitutional: No recent illness  HEENT: No  headache, no vision change  Cardiac: No  chest pain, No  pressure, No palpitations  Respiratory:  No  shortness of breath. No  Cough  Gastrointestinal: No  abdominal pain  Musculoskeletal: No new myalgia/arthralgia  Psychiatric: +concerns with depression, +concerns with anxiety  Exam:  BP 139/72 (BP Location: Left Arm, Patient Position: Sitting, Cuff Size: Normal)   Pulse 79   Temp 98 F (36.7 C) (Oral)   Wt 239 lb 14.4 oz (108.8 kg)   BMI 35.43 kg/m   Constitutional: VS see above. General Appearance: alert, well-developed, well-nourished, NAD  Eyes: Normal lids and conjunctive, non-icteric sclera  Ears, Nose, Mouth, Throat: MMM, Normal external inspection ears/nares/mouth/lips/gums.  Neck: No masses, trachea midline.   Respiratory: Normal respiratory effort. no wheeze, no rhonchi, no rales  Cardiovascular: S1/S2 normal, no murmur, no rub/gallop auscultated. RRR.   Neurological: Normal balance/coordination. No tremor.  Skin: warm, dry, intact.   Psychiatric: Normal judgment/insight. Normal mood and affect. Oriented x3.   Visit summary with medication list and pertinent instructions was printed for patient to review, advised to alert [place] if any changes needed. All questions at time of visit were answered - patient instructed to contact office with any additional concerns. ER/RTC precautions were reviewed with the patient and understanding verbalized.   Follow-up plan: Return in about 3 months (around 08/28/2018) for recheck on medicines, sooner if needed .  Note: Total time spent 25 minutes, greater than 50% of the visit was spent face-to-face counseling and coordinating care for the following: The primary encounter diagnosis was Gout,  unspecified cause, unspecified chronicity, unspecified site. Diagnoses of Anxiety, Sciatic neuropathy, right, Spinal stenosis of lumbar region with neurogenic claudication, and Chronic neuropathic pain were also pertinent to this visit.Marland Kitchen  Please note: voice recognition software was used to produce this document, and typos may escape review. Please contact Dr. Lyn Hollingshead for any needed clarifications.

## 2018-05-30 ENCOUNTER — Encounter: Payer: Self-pay | Admitting: Osteopathic Medicine

## 2018-06-12 ENCOUNTER — Other Ambulatory Visit: Payer: Self-pay

## 2018-06-12 MED ORDER — ALLOPURINOL 100 MG PO TABS: ORAL_TABLET | ORAL | 1 refills | Status: DC

## 2018-06-30 ENCOUNTER — Other Ambulatory Visit: Payer: Self-pay | Admitting: Osteopathic Medicine

## 2018-06-30 DIAGNOSIS — G8929 Other chronic pain: Secondary | ICD-10-CM

## 2018-06-30 DIAGNOSIS — M792 Neuralgia and neuritis, unspecified: Principal | ICD-10-CM

## 2018-07-01 NOTE — Telephone Encounter (Signed)
Refill for gabapentin 400 mg  Pt seen at [facility] by Sunnie Nielsen.  Please review.

## 2018-07-22 NOTE — Telephone Encounter (Signed)
Error

## 2018-07-26 ENCOUNTER — Telehealth: Payer: Self-pay

## 2018-07-26 NOTE — Telephone Encounter (Signed)
OK. I haven't seen anything yet. Also, I got notified that psych downstairs rejected the referral - is this the practice he's talking about? I can also refer him elsewhere.

## 2018-07-26 NOTE — Telephone Encounter (Addendum)
Pt called - wanted provider to be aware that paperwork/referral request will be sent from psychiatrist for provider to complete.

## 2018-07-26 NOTE — Telephone Encounter (Signed)
Ok will keep an eye out for it!

## 2018-07-26 NOTE — Telephone Encounter (Signed)
Pt did not mention that psych downstairs rejected the referral. He only stated that paperwork will be sent, but did not specify when.

## 2018-08-28 ENCOUNTER — Ambulatory Visit (INDEPENDENT_AMBULATORY_CARE_PROVIDER_SITE_OTHER): Payer: Medicaid Other | Admitting: Osteopathic Medicine

## 2018-08-28 ENCOUNTER — Encounter: Payer: Self-pay | Admitting: Osteopathic Medicine

## 2018-08-28 VITALS — BP 125/73 | HR 85 | Temp 98.2°F | Wt 251.5 lb

## 2018-08-28 DIAGNOSIS — M48062 Spinal stenosis, lumbar region with neurogenic claudication: Secondary | ICD-10-CM | POA: Diagnosis not present

## 2018-08-28 DIAGNOSIS — Z8659 Personal history of other mental and behavioral disorders: Secondary | ICD-10-CM

## 2018-08-28 DIAGNOSIS — G4709 Other insomnia: Secondary | ICD-10-CM | POA: Diagnosis not present

## 2018-08-28 DIAGNOSIS — M792 Neuralgia and neuritis, unspecified: Secondary | ICD-10-CM | POA: Diagnosis not present

## 2018-08-28 DIAGNOSIS — G8929 Other chronic pain: Secondary | ICD-10-CM

## 2018-08-28 MED ORDER — QUETIAPINE FUMARATE 50 MG PO TABS: 50.0000 mg | ORAL_TABLET | Freq: Every day | ORAL | 0 refills | Status: DC

## 2018-08-28 NOTE — Patient Instructions (Signed)
Recommend calling your insurance and see who they cover for PSYCHIATRY  Continue with current counselor!  Refills are at the pharmacy.   Labs at your convenience.

## 2018-08-28 NOTE — Progress Notes (Signed)
HPI: Bobby Shaw is a 42 y.o. male who  has a past medical history of Alcohol abuse, Chronic diarrhea, Depression with anxiety, Hypertension, LFT elevation, Lung collapse (2001), Murmur, and Seizure (HCC).  he presents to [facility] today, 08/28/18,  for chief complaint of:  Medication refill   Going to [facility], is getting counseling for bipolar and PTSD which is going okay but we have made several referrals to psychiatry without any luck.   Needs refill on Seroquel.   Recent hospitalization for EtOH dependence, detox. Notes reviewed. No D/C summary available, H&P and psych notes reviewed, pt planning to go back to AA rather than intensive OP treatment    Wt Readings from Last 3 Encounters:  08/28/18 251 lb 8 oz (114.1 kg)  05/29/18 239 lb 14.4 oz (108.8 kg)  04/22/18 240 lb 14.4 oz (109.3 kg)        At today's visit... Past medical history, surgical history, and family history reviewed and updated as needed.  Current medication list and allergy/intolerance information reviewed and updated as needed. (See remainder of HPI, ROS, Phys Exam below)   No results found.  No results found for this or any previous visit (from the past 72 hour(s)).        ASSESSMENT/PLAN: The primary encounter diagnosis was Spinal stenosis of lumbar region with neurogenic claudication. Diagnoses of Chronic neuropathic pain and Other insomnia were also pertinent to this visit.     Meds ordered this encounter  Medications  . QUEtiapine (SEROQUEL) 50 MG tablet    Sig: Take 1 tablet (50 mg total) by mouth at bedtime.    Dispense:  90 tablet    Refill:  0    Patient Instructions  Recommend calling your insurance and see who they cover for PSYCHIATRY  Continue with current counselor!  Refills are at the pharmacy.   Labs at your convenience.      Follow-up plan: Return in about 3 months (around 11/27/2018) for check-up moods unless can getin to  see psychiatry before then .                             ############################################ ############################################ ############################################ ############################################    Current Meds  Medication Sig  . allopurinol (ZYLOPRIM) 100 MG tablet Take 1 tablet (100 mg total) by mouth daily for 30 days, THEN 2 tablets (200 mg total) daily.  Marland Kitchen amitriptyline (ELAVIL) 50 MG tablet Take 1 tablet (50 mg total) by mouth at bedtime.  Marland Kitchen atorvastatin (LIPITOR) 20 MG tablet Take 1 tablet (20 mg total) by mouth daily.  Marland Kitchen b complex vitamins capsule Take 1 capsule by mouth daily.  . B Complex-Biotin-FA (SUPER B-50 B COMPLEX) CAPS Take by mouth.  . Buprenorphine HCl-Naloxone HCl (SUBOXONE SL) Place 8 mg under the tongue 2 (two) times daily.   . cholecalciferol (VITAMIN D) 1000 units tablet Take 1,000 Units by mouth daily.  . Colchicine (MITIGARE) 0.6 MG CAPS Take 0.6 mg by mouth daily.  Marland Kitchen doxycycline (VIBRA-TABS) 100 MG tablet Take 1 tablet (100 mg total) by mouth 2 (two) times daily.  . DULoxetine (CYMBALTA) 60 MG capsule Take 1 capsule (60 mg total) by mouth daily.  . ferrous sulfate 325 (65 FE) MG EC tablet Take 1 tablet (325 mg total) by mouth 3 (three) times daily with meals.  . gabapentin (NEURONTIN) 400 MG capsule Take 2 capsules (800 mg total) by mouth 3 (three) times daily.  Marland Kitchen  ibuprofen (ADVIL,MOTRIN) 800 MG tablet Take 1 tablet (800 mg total) by mouth every 8 (eight) hours as needed.  . indomethacin (INDOCIN) 50 MG capsule TAKE ONE CAPSULE PO 3 TIMES PER DAY WHILE PAIN LASTS, THEN DECREASE TO 1 - 2 TIMES PER DAY AS NEEDED  . losartan (COZAAR) 100 MG tablet Take 1 tablet (100 mg total) by mouth daily.  . QUEtiapine (SEROQUEL) 50 MG tablet Take 1 tablet (50 mg total) by mouth at bedtime.    No Known Allergies     Review of Systems:  Constitutional: No fever/chills  Cardiac: No  chest pain, No   pressure, No palpitations  Respiratory:  No  shortness of breath. No  Cough  Gastrointestinal: No  abdominal pain  Musculoskeletal: No new myalgia/arthralgia, +chronic pain stable   Skin: No  Rash  Neurologic: No  weakness, No  Dizziness  Psychiatric: +concerns with depression, +concerns with anxiety, +EtOH recent issues as above but he has been sober since hospital discharge   Exam:  BP 125/73   Pulse 85   Temp 98.2 F (36.8 C) (Oral)   Wt 251 lb 8 oz (114.1 kg)   BMI 37.14 kg/m   Constitutional: VS see above. General Appearance: alert, well-developed, well-nourished, NAD  Eyes: Normal lids and conjunctive, non-icteric sclera  Ears, Nose, Mouth, Throat: MMM, Normal external inspection ears/nares/mouth/lips/gums.  Neck: No masses, trachea midline.   Respiratory: Normal respiratory effort. no wheeze, no rhonchi, no rales  Cardiovascular: S1/S2 normal, no murmur, no rub/gallop auscultated. RRR.   Abdominal: non-tender, non-distended, no appreciable organomegaly, neg Murphy's, BS [id number]  Neurological: Normal balance/coordination. No tremor.  Skin: warm, dry, intact.   Psychiatric: Normal judgment/insight. Normal mood and affect. Oriented x3.       Visit summary with medication list and pertinent instructions was printed for patient to review, patient was advised to alert us if any updates are needed. All questions at time of visit were answered - patient instructed to contact office with any additional concerns. ER/RTC precautions were reviewed with the patient and understanding verbalized.   Note: Total time spent 25 minutes, greater than 50% of the visit was spent face-to-face counseling and coordinating care for the following: The primary encounter diagnosis was Spinal stenosis of lumbar region with neurogenic claudication. Diagnoses of Chronic neuropathic pain, Other insomnia, and History of mood disorder were also pertinent to this visit.Marland Kitchen.  Please note: voice  recognition software was used to produce this document, and typos may escape review. Please contact Dr. Lyn HollingsheadAlexander for any needed clarifications.    Follow up plan: Return in about 3 months (around 11/27/2018) for check-up moods unless can getin to see psychiatry before then .

## 2018-10-24 ENCOUNTER — Other Ambulatory Visit: Payer: Self-pay | Admitting: Osteopathic Medicine

## 2018-10-24 NOTE — Telephone Encounter (Signed)
Please review for refill- patient at [facility] 

## 2018-11-01 DIAGNOSIS — G90521 Complex regional pain syndrome I of right lower limb: Secondary | ICD-10-CM | POA: Diagnosis not present

## 2018-11-01 DIAGNOSIS — G5771 Causalgia of right lower limb: Secondary | ICD-10-CM | POA: Diagnosis not present

## 2018-11-11 DIAGNOSIS — F419 Anxiety disorder, unspecified: Secondary | ICD-10-CM | POA: Diagnosis not present

## 2018-11-11 DIAGNOSIS — I5032 Chronic diastolic (congestive) heart failure: Secondary | ICD-10-CM | POA: Diagnosis not present

## 2018-11-11 DIAGNOSIS — F329 Major depressive disorder, single episode, unspecified: Secondary | ICD-10-CM | POA: Diagnosis not present

## 2018-11-11 DIAGNOSIS — R569 Unspecified convulsions: Secondary | ICD-10-CM | POA: Diagnosis not present

## 2018-11-11 DIAGNOSIS — G8911 Acute pain due to trauma: Secondary | ICD-10-CM | POA: Diagnosis not present

## 2018-11-11 DIAGNOSIS — Z79899 Other long term (current) drug therapy: Secondary | ICD-10-CM | POA: Diagnosis not present

## 2018-11-11 DIAGNOSIS — T18128A Food in esophagus causing other injury, initial encounter: Secondary | ICD-10-CM | POA: Diagnosis not present

## 2018-11-11 DIAGNOSIS — F17210 Nicotine dependence, cigarettes, uncomplicated: Secondary | ICD-10-CM | POA: Diagnosis not present

## 2018-11-11 DIAGNOSIS — I110 Hypertensive heart disease with heart failure: Secondary | ICD-10-CM | POA: Diagnosis not present

## 2018-11-15 ENCOUNTER — Other Ambulatory Visit: Payer: Self-pay | Admitting: Osteopathic Medicine

## 2018-11-15 NOTE — Telephone Encounter (Signed)
I got his sleep study results from [facility], said an order should be attached btu nothing was. Faxed back - request order if applicable, not sure if pt is asking for O2 via [place] or if he needs CPAP

## 2018-11-21 ENCOUNTER — Encounter: Payer: Self-pay | Admitting: Osteopathic Medicine

## 2018-11-21 ENCOUNTER — Ambulatory Visit (INDEPENDENT_AMBULATORY_CARE_PROVIDER_SITE_OTHER): Payer: Medicare Other | Admitting: Osteopathic Medicine

## 2018-11-21 VITALS — BP 118/77 | HR 83 | Temp 98.0°F | Wt 266.4 lb

## 2018-11-21 DIAGNOSIS — R7303 Prediabetes: Secondary | ICD-10-CM | POA: Diagnosis not present

## 2018-11-21 DIAGNOSIS — G8929 Other chronic pain: Secondary | ICD-10-CM

## 2018-11-21 DIAGNOSIS — G5701 Lesion of sciatic nerve, right lower limb: Secondary | ICD-10-CM | POA: Diagnosis not present

## 2018-11-21 DIAGNOSIS — F5101 Primary insomnia: Secondary | ICD-10-CM

## 2018-11-21 DIAGNOSIS — R1310 Dysphagia, unspecified: Secondary | ICD-10-CM | POA: Diagnosis not present

## 2018-11-21 DIAGNOSIS — M5441 Lumbago with sciatica, right side: Secondary | ICD-10-CM | POA: Diagnosis not present

## 2018-11-21 DIAGNOSIS — M5442 Lumbago with sciatica, left side: Secondary | ICD-10-CM | POA: Diagnosis not present

## 2018-11-21 DIAGNOSIS — I10 Essential (primary) hypertension: Secondary | ICD-10-CM

## 2018-11-21 DIAGNOSIS — M792 Neuralgia and neuritis, unspecified: Secondary | ICD-10-CM | POA: Diagnosis not present

## 2018-11-21 DIAGNOSIS — G4734 Idiopathic sleep related nonobstructive alveolar hypoventilation: Secondary | ICD-10-CM | POA: Diagnosis not present

## 2018-11-21 DIAGNOSIS — Z8739 Personal history of other diseases of the musculoskeletal system and connective tissue: Secondary | ICD-10-CM

## 2018-11-21 NOTE — Patient Instructions (Addendum)
Plan: Seroquel from 50 mg to 100 mg Referral to physical therapy, see below Labs today  Gi referral to address swallowing problem, see below

## 2018-11-21 NOTE — Progress Notes (Addendum)
HPI: Bobby Shaw is a 43 y.o. male who  has a past medical history of Alcohol abuse, Chronic diarrhea, Depression with anxiety, Hypertension, LFT elevation, Lung collapse (2001), Murmur, and Seizure (HCC).  he presents to [facility] today, 11/21/18,  for chief complaint of: Hospital follow-up: choking Concern about diabetes R foot pain Requests physical therapy  Insomnia Home O2  Recent ER visit for choking.  Had felt like a piece of pork was stuck in his throat, went to the emergency room, symptoms resolved after was given a can of Coke there.  Patient was discharged home to follow-up with PCP.  He reports persistent difficulty swallowing, on and off going for years.  No pain with swallowing, no regurgitation.  Having some difficulty getting in to be seen by his neurosurgery team, reports that since he has had a spinal stimulator removed his right leg hurts a great deal.  He is currently following with pain management.  [facility] referral to physical therapy for lower leg pain and back pain  Request labs done, he is concerned about possible prediabetes  Still having insomnia issues, requests to possibly increase the Seroquel dosage.  Using and benefiting from oxygen nightly.       Past medical, surgical, social and family history reviewed:  Patient Active Problem List   Diagnosis Date Noted  . Umbilical hernia without obstruction and without gangrene 01/22/2018  . 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 Hypertriglyceridemia 11/25/2017  . Alkaline phosphatase elevation 11/25/2017  . Iron deficiency anemia 11/25/2017  . Substance induced mood disorder (HCC) 11/25/2017  . Chronic neuropathic pain 11/25/2017  . Coronary arteriosclerosis due to lipid rich plaque 08/21/2017  . Lumbar spinal stenosis 08/13/2017  . Pulmonary nodule 05/22/2017  . Biceps tendinitis of left shoulder 03/20/2017  . Seizure due to alcohol withdrawal (HCC)  03/16/2017  . Gait difficulty 03/08/2017  . Tobacco abuse 03/08/2017  . Chronic diastolic congestive heart failure (HCC) 02/23/2017  . Drug use 02/23/2017  . Alcohol use disorder, severe, dependence (HCC) 02/22/2017  . 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 Bipolar disorder (HCC) 10/15/2016  . Right leg swelling 08/22/2016  . Sciatic neuropathy, right 08/16/2016  . Polysubstance abuse (HCC) 07/07/2016  . ANXIETY 02/13/2010  . Hypertension goal BP (blood pressure) < 130/80 02/13/2010  . WRIST PAIN, LEFT 02/13/2010    Past Surgical History:  Procedure Laterality Date  . FOOT SURGERY      Social History   Tobacco Use  . Smoking status: Current Every Day Smoker    Packs/day: 1.00    Years: 20.00    Pack years: 20.00    Types: Cigarettes  . Smokeless tobacco: Never Used  Substance Use Topics  . Alcohol use: Yes    Comment: per chart, former alcohol abuse    Family History  Problem Relation Age of Onset  . Hypertension Father      Current medication list and allergy/intolerance information reviewed:    Current Outpatient Medications  Medication Sig Dispense Refill  . amitriptyline (ELAVIL) 50 MG tablet Take 1 tablet (50 mg total) by mouth at bedtime. 90 tablet 0  . atorvastatin (LIPITOR) 20 MG tablet TAKE 1 TABLET BY MOUTH ONCE DAILY 30 tablet 1  . b complex vitamins capsule Take 1 capsule by mouth daily.    . B Complex-Biotin-FA (SUPER B-50 B COMPLEX) CAPS Take by mouth.    . Buprenorphine HCl-Naloxone HCl (SUBOXONE SL) Place 8 mg under  the tongue 2 (two) times daily.     . cholecalciferol (VITAMIN D) 1000 units tablet Take 1,000 Units by mouth daily.    . Colchicine (MITIGARE) 0.6 MG CAPS Take 0.6 mg by mouth daily. 90 capsule 0  . DULoxetine (CYMBALTA) 60 MG capsule Take 1 capsule (60 mg total) by mouth daily. 90 capsule 3  . ibuprofen (ADVIL,MOTRIN) 800 MG tablet Take 1 tablet (800 mg total) by mouth every 8 (eight) hours as  needed. 90 tablet 2  . indomethacin (INDOCIN) 50 MG capsule TAKE ONE CAPSULE PO 3 TIMES PER DAY WHILE PAIN LASTS, THEN DECREASE TO 1 - 2 TIMES PER DAY AS NEEDED 30 capsule 1  . losartan (COZAAR) 100 MG tablet Take 1 tablet (100 mg total) by mouth daily. 90 tablet 3  . QUEtiapine (SEROQUEL) 50 MG tablet Take 1 tablet (50 mg total) by mouth at bedtime. 90 tablet 0  . allopurinol (ZYLOPRIM) 100 MG tablet Take 1 tablet (100 mg total) by mouth daily for 30 days, THEN 2 tablets (200 mg total) daily. 150 tablet 1  . ferrous sulfate 325 (65 FE) MG EC tablet Take 1 tablet (325 mg total) by mouth 3 (three) times daily with meals. (Patient not taking: Reported on 11/21/2018) 90 tablet 11  . gabapentin (NEURONTIN) 400 MG capsule Take 2 capsules (800 mg total) by mouth 3 (three) times daily. 540 capsule 1   No current facility-administered medications for this visit.     No Known Allergies    Review of Systems:  Constitutional:  No  fever, no chills, No recent illness  HEENT: No  headache, no vision change  Cardiac: No  chest pain, No  pressure, No palpitations, No  Orthopnea  Respiratory:  No  shortness of breath. No  Cough  Gastrointestinal: No  abdominal pain, No  nausea, No  vomiting,  No  blood in stool, No  diarrhea, No  constipation   Musculoskeletal: +new myalgia/arthralgia  Neurologic: No  weakness, No  dizziness, No  slurred speech/focal weakness/facial droop  Psychiatric: +sleep problems  Exam:  BP 118/77 (BP Location: Left Arm, Patient Position: Sitting, Cuff Size: Normal)   Pulse 83   Temp 98 F (36.7 C) (Oral)   Wt 266 lb 6.4 oz (120.8 kg)   BMI 39.34 kg/m   Constitutional: VS see above. General Appearance: alert, well-developed, well-nourished, NAD  Respiratory: Normal respiratory effort.   Musculoskeletal: Gait antalgic favoring R leg  Neurological: Normal balance/coordination. No tremor.  Skin: warm, dry, intact. No rash/ulcer.   Psychiatric: Normal  judgment/insight. Normal mood and affect. Oriented x3.      ASSESSMENT/PLAN: The primary encounter diagnosis was Sciatic neuropathy, right. Diagnoses of Prediabetes, Primary insomnia, Chronic neuropathic pain, History of gout, Hypertension goal BP (blood pressure) < 130/80, Chronic bilateral low back pain with bilateral sciatica, Dysphagia, unspecified type, and Nocturnal hypoxemia were also pertinent to this visit.   Orders Placed This Encounter  Procedures  . CBC  . COMPLETE METABOLIC PANEL WITH GFR  . Lipid panel  . Hemoglobin A1c  . Uric acid  . Ambulatory referral to Physical Therapy  . Ambulatory referral to Gastroenterology      Patient Instructions  Plan: Seroquel from 50 mg to 100 mg Referral to physical therapy, see below Labs today  Gi referral to address swallowing problem, see below        Visit summary with medication list and pertinent instructions was printed for patient to review. All questions at time of visit were  answered - patient instructed to contact office with any additional concerns or updates. ER/RTC precautions were reviewed with the patient.   Note: Total time spent 25 minutes, greater than 50% of the visit was spent face-to-face counseling and coordinating care for the above diagnoses listed in assessment/plan.   Please note: voice recognition software was used to produce this document, and typos may escape review. Please contact Dr. Lyn Hollingshead for any needed clarifications.     Follow-up plan: Return in about 3 months (around 02/19/2019) for annual physical / preventive care. See me sooner if needed. Can cancel 11/27/2018 appt .

## 2018-11-25 ENCOUNTER — Encounter: Payer: Self-pay | Admitting: Gastroenterology

## 2018-11-27 ENCOUNTER — Ambulatory Visit: Payer: Medicaid Other | Admitting: Osteopathic Medicine

## 2018-11-27 DIAGNOSIS — I10 Essential (primary) hypertension: Secondary | ICD-10-CM | POA: Diagnosis not present

## 2018-11-27 DIAGNOSIS — Z8739 Personal history of other diseases of the musculoskeletal system and connective tissue: Secondary | ICD-10-CM | POA: Diagnosis not present

## 2018-11-27 DIAGNOSIS — M792 Neuralgia and neuritis, unspecified: Secondary | ICD-10-CM | POA: Diagnosis not present

## 2018-11-27 DIAGNOSIS — R7303 Prediabetes: Secondary | ICD-10-CM | POA: Diagnosis not present

## 2018-11-27 DIAGNOSIS — G8929 Other chronic pain: Secondary | ICD-10-CM | POA: Diagnosis not present

## 2018-11-27 DIAGNOSIS — F5101 Primary insomnia: Secondary | ICD-10-CM | POA: Diagnosis not present

## 2018-11-27 DIAGNOSIS — G5701 Lesion of sciatic nerve, right lower limb: Secondary | ICD-10-CM | POA: Diagnosis not present

## 2018-11-28 LAB — COMPLETE METABOLIC PANEL WITH GFR
AG RATIO: 1.8 (calc) (ref 1.0–2.5)
ALBUMIN MSPROF: 4.3 g/dL (ref 3.6–5.1)
ALT: 54 U/L — AB (ref 9–46)
AST: 39 U/L (ref 10–40)
Alkaline phosphatase (APISO): 121 U/L (ref 36–130)
BUN: 14 mg/dL (ref 7–25)
CALCIUM: 9.3 mg/dL (ref 8.6–10.3)
CO2: 35 mmol/L — ABNORMAL HIGH (ref 20–32)
Chloride: 100 mmol/L (ref 98–110)
Creat: 0.81 mg/dL (ref 0.60–1.35)
GFR, EST AFRICAN AMERICAN: 128 mL/min/{1.73_m2} (ref >=60)
GFR, EST NON AFRICAN AMERICAN: 110 mL/min/{1.73_m2} (ref >=60)
GLOBULIN: 2.4 g/dL (ref 1.9–3.7)
Glucose, Bld: 154 mg/dL — ABNORMAL HIGH (ref 65–99)
POTASSIUM: 4.5 mmol/L (ref 3.5–5.3)
Sodium: 139 mmol/L (ref 135–146)
TOTAL PROTEIN: 6.7 g/dL (ref 6.1–8.1)
Total Bilirubin: 0.8 mg/dL (ref 0.2–1.2)

## 2018-11-28 LAB — LIPID PANEL
Cholesterol: 155 mg/dL (ref <200)
HDL: 29 mg/dL — ABNORMAL LOW (ref >=40)
NON-HDL CHOLESTEROL (CALC): 126 mg/dL (ref <130)
Total CHOL/HDL Ratio: 5.3 (calc) — ABNORMAL HIGH (ref <5.0)
Triglycerides: 498 mg/dL — ABNORMAL HIGH (ref <150)

## 2018-11-28 LAB — URIC ACID: URIC ACID, SERUM: 5.5 mg/dL (ref 4.0–8.0)

## 2018-11-28 LAB — CBC
HCT: 45.1 % (ref 38.5–50.0)
Hemoglobin: 15.4 g/dL (ref 13.2–17.1)
MCH: 30.4 pg (ref 27.0–33.0)
MCHC: 34.1 g/dL (ref 32.0–36.0)
MCV: 89 fL (ref 80.0–100.0)
MPV: 11 fL (ref 7.5–12.5)
Platelets: 195 10*3/uL (ref 140–400)
RBC: 5.07 10*6/uL (ref 4.20–5.80)
RDW: 12.3 % (ref 11.0–15.0)
WBC: 6.3 10*3/uL (ref 3.8–10.8)

## 2018-11-28 LAB — HEMOGLOBIN A1C
HEMOGLOBIN A1C: 6.1 %{Hb} — AB (ref <5.7)
Mean Plasma Glucose: 128 (calc)
eAG (mmol/L): 7.1 (calc)

## 2018-11-29 DIAGNOSIS — G4734 Idiopathic sleep related nonobstructive alveolar hypoventilation: Secondary | ICD-10-CM | POA: Insufficient documentation

## 2018-12-02 ENCOUNTER — Telehealth: Payer: Self-pay | Admitting: Osteopathic Medicine

## 2018-12-02 NOTE — Telephone Encounter (Signed)
Mother called stating that patient needs a face to face tomorrow for Chronic and stable state on his oxygen. No open slots. Please advise.

## 2018-12-02 NOTE — Telephone Encounter (Signed)
I already took care of this, addended last visit note, he shouldn't need another visit. Have him call his oxygen supplier and confirm this?

## 2018-12-03 NOTE — Telephone Encounter (Signed)
Patient's mom advised 

## 2018-12-10 ENCOUNTER — Ambulatory Visit: Payer: Medicare Other | Admitting: Gastroenterology

## 2018-12-21 ENCOUNTER — Other Ambulatory Visit: Payer: Self-pay | Admitting: Osteopathic Medicine

## 2018-12-27 ENCOUNTER — Other Ambulatory Visit: Payer: Self-pay | Admitting: Osteopathic Medicine

## 2019-01-14 ENCOUNTER — Other Ambulatory Visit: Payer: Self-pay

## 2019-01-14 ENCOUNTER — Other Ambulatory Visit: Payer: Self-pay | Admitting: Osteopathic Medicine

## 2019-01-14 NOTE — Telephone Encounter (Signed)
   Requested Prescriptions  Pending Prescriptions Disp Refills   atorvastatin (LIPITOR) 20 MG tablet [Pharmacy Med Name: Atorvastatin Calcium 20 MG Oral Tablet] 30 tablet 0    Sig: Take 1 tablet by mouth once daily     Cardiovascular:  Antilipid - Statins Failed - 01/14/2019 11:35 AM      Failed - HDL in normal range and within 360 days    HDL  Date Value Ref Range Status  11/27/2018 29 (L) > OR = 40 mg/dL Final         Failed - Triglycerides in normal range and within 360 days    Triglycerides  Date Value Ref Range Status  11/27/2018 498 (H) <150 mg/dL Final    Comment:    . If a non-fasting specimen was collected, consider repeat triglyceride testing on a fasting specimen if clinically indicated.  Perry Mount et al. J. of Clin. Lipidol. 2015;9:129-169. .          Failed - Valid encounter within last 12 months    Recent Outpatient Visits          1 month ago Sciatic neuropathy, right   [place] [place] [facility], [place], DO   4 months ago Spinal stenosis of lumbar region with neurogenic claudication   [facility], [place], DO   7 months ago Gout, unspecified cause, unspecified chronicity, unspecified site   [facility], [place], DO   8 months ago Spinal stenosis of lumbar region with neurogenic claudication   [facility], [place], DO   9 months ago Cellulitis and abscess of neck   [place] [facility], Dorene Grebe, DO      Future Appointments            In 1 month Sunnie Nielsen, DO [facility] - Total Cholesterol in normal range and within 360 days    Cholesterol  Date Value Ref Range Status  11/27/2018 155 <200 mg/dL Final         Passed - LDL in normal range and within 360 days    LDL Cholesterol (Calc)   Date Value Ref Range Status  11/27/2018  mg/dL (calc) Final    Comment:    . LDL cholesterol not calculated. Triglyceride levels greater than 400 mg/dL invalidate calculated LDL results. . Reference range: <100 . Desirable range <100 mg/dL for primary prevention;   <70 mg/dL for patients with CHD or diabetic patients  with > or = 2 CHD risk factors. Marland Kitchen LDL-C is now calculated using the Martin-Hopkins  calculation, which is a validated novel method providing  better accuracy than the Friedewald equation in the  estimation of LDL-C.  Horald Pollen et al. Lenox Ahr. 6761;950(93): 2061-2068  (http://education.[facility].com/faq/FAQ164)          Passed - Patient is not pregnant

## 2019-01-14 NOTE — Telephone Encounter (Signed)
When would you like him to have his cholesterol rechecked.

## 2019-01-20 ENCOUNTER — Other Ambulatory Visit: Payer: Self-pay | Admitting: Physician Assistant

## 2019-01-20 DIAGNOSIS — I10 Essential (primary) hypertension: Secondary | ICD-10-CM

## 2019-02-20 ENCOUNTER — Encounter: Payer: Medicare Other | Admitting: Osteopathic Medicine

## 2019-02-20 ENCOUNTER — Other Ambulatory Visit: Payer: Self-pay | Admitting: Osteopathic Medicine

## 2019-03-01 ENCOUNTER — Other Ambulatory Visit: Payer: Self-pay | Admitting: Osteopathic Medicine

## 2019-03-01 DIAGNOSIS — G5701 Lesion of sciatic nerve, right lower limb: Secondary | ICD-10-CM

## 2019-03-01 DIAGNOSIS — G8929 Other chronic pain: Secondary | ICD-10-CM

## 2019-03-08 ENCOUNTER — Other Ambulatory Visit: Payer: Self-pay | Admitting: Osteopathic Medicine

## 2019-03-08 DIAGNOSIS — Z8659 Personal history of other mental and behavioral disorders: Secondary | ICD-10-CM

## 2019-03-08 DIAGNOSIS — G4709 Other insomnia: Secondary | ICD-10-CM

## 2019-03-10 NOTE — Telephone Encounter (Signed)
Please review

## 2019-03-18 ENCOUNTER — Encounter: Payer: Self-pay | Admitting: Osteopathic Medicine

## 2019-03-31 ENCOUNTER — Other Ambulatory Visit: Payer: Self-pay | Admitting: Osteopathic Medicine

## 2019-04-14 ENCOUNTER — Other Ambulatory Visit: Payer: Self-pay | Admitting: Osteopathic Medicine

## 2019-04-14 DIAGNOSIS — G4709 Other insomnia: Secondary | ICD-10-CM

## 2019-04-14 DIAGNOSIS — Z8659 Personal history of other mental and behavioral disorders: Secondary | ICD-10-CM

## 2019-04-14 NOTE — Telephone Encounter (Signed)
Please review for refill- patient at [facility] 

## 2019-04-16 ENCOUNTER — Other Ambulatory Visit: Payer: Self-pay

## 2019-04-16 ENCOUNTER — Telehealth: Payer: Self-pay

## 2019-04-16 ENCOUNTER — Ambulatory Visit (INDEPENDENT_AMBULATORY_CARE_PROVIDER_SITE_OTHER): Payer: Medicare Other | Admitting: Osteopathic Medicine

## 2019-04-16 ENCOUNTER — Encounter: Payer: Self-pay | Admitting: Osteopathic Medicine

## 2019-04-16 DIAGNOSIS — M792 Neuralgia and neuritis, unspecified: Secondary | ICD-10-CM | POA: Diagnosis not present

## 2019-04-16 DIAGNOSIS — G4709 Other insomnia: Secondary | ICD-10-CM

## 2019-04-16 DIAGNOSIS — Z8659 Personal history of other mental and behavioral disorders: Secondary | ICD-10-CM

## 2019-04-16 DIAGNOSIS — G8929 Other chronic pain: Secondary | ICD-10-CM | POA: Diagnosis not present

## 2019-04-16 DIAGNOSIS — G5701 Lesion of sciatic nerve, right lower limb: Secondary | ICD-10-CM

## 2019-04-16 MED ORDER — AMITRIPTYLINE HCL 50 MG PO TABS: 50.0000 mg | ORAL_TABLET | Freq: Every day | ORAL | 1 refills | Status: DC

## 2019-04-16 MED ORDER — QUETIAPINE FUMARATE 50 MG PO TABS: 50.0000 mg | ORAL_TABLET | Freq: Every day | ORAL | 1 refills | Status: DC

## 2019-04-16 NOTE — Progress Notes (Signed)
Attempted to contact pt at 1210 pm. No answer, unable to leave a vm.
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
## 2019-04-16 NOTE — Telephone Encounter (Signed)
Pt left a vm msg stating that he forgot to mention he is still having problems with sleeping. Current rx does not help. Wants to know what provider recommends. Pt also stated what will be the plan of care for his back issues. He apologize for not bringing up these additional issues during his virtual visit. Pls advise, thanks.

## 2019-04-18 MED ORDER — IBUPROFEN 800 MG PO TABS: 800.0000 mg | ORAL_TABLET | Freq: Three times a day (TID) | ORAL | 2 refills | Status: DC | PRN

## 2019-04-18 NOTE — Telephone Encounter (Signed)
Left pt msg of recommendations. Call back info provided in case of any further needs.

## 2019-04-18 NOTE — Telephone Encounter (Signed)
I referred him to psychiatry, I think they might be able to help [place] with medications for sleep.  Along with his other refills, I sent in the higher dose ibuprofen which he can take as needed for back pain.

## 2019-04-20 ENCOUNTER — Other Ambulatory Visit: Payer: Self-pay | Admitting: Osteopathic Medicine

## 2019-04-20 DIAGNOSIS — G5701 Lesion of sciatic nerve, right lower limb: Secondary | ICD-10-CM

## 2019-05-03 ENCOUNTER — Other Ambulatory Visit: Payer: Self-pay | Admitting: Physician Assistant

## 2019-05-03 DIAGNOSIS — I10 Essential (primary) hypertension: Secondary | ICD-10-CM

## 2019-05-07 ENCOUNTER — Telehealth: Payer: Self-pay | Admitting: Osteopathic Medicine

## 2019-05-07 ENCOUNTER — Ambulatory Visit: Payer: Medicare Other | Admitting: Physical Therapy

## 2019-05-07 DIAGNOSIS — Z20822 Contact with and (suspected) exposure to covid-19: Secondary | ICD-10-CM

## 2019-05-07 NOTE — Telephone Encounter (Signed)
Patient called to get info about testing cites. He is going to get tested on his own shortly. Wanted to inform his PCP he may have been exposed to someone with Covid-19.

## 2019-05-07 NOTE — Telephone Encounter (Signed)
Orders are in [facility] [place] in [place]

## 2019-05-08 NOTE — Telephone Encounter (Signed)
Pt has been updated of order for Covid testing. As per pt, will stop by the site today. No other inquiries during call.

## 2019-05-21 ENCOUNTER — Ambulatory Visit (INDEPENDENT_AMBULATORY_CARE_PROVIDER_SITE_OTHER): Payer: Medicare Other | Admitting: Osteopathic Medicine

## 2019-05-21 DIAGNOSIS — Z0289 Encounter for other administrative examinations: Secondary | ICD-10-CM

## 2019-05-21 NOTE — Progress Notes (Signed)
Called pt at 254 pm and 310 pm, no answer. Left a vm msg.

## 2019-05-28 ENCOUNTER — Other Ambulatory Visit: Payer: Self-pay | Admitting: Osteopathic Medicine

## 2019-05-28 DIAGNOSIS — G5701 Lesion of sciatic nerve, right lower limb: Secondary | ICD-10-CM

## 2019-05-28 NOTE — Telephone Encounter (Signed)
Requested medication (s) are due for refill today:yes Requested medication (s) are on the active medication list: yes  Last refill:  yes  Future visit scheduled: yes  Notes to clinic:  Review for refill Patient has appointment on 06/03/2019   Requested Prescriptions  Pending Prescriptions Disp Refills   DULoxetine (CYMBALTA) 60 MG capsule [Pharmacy Med Name: DULoxetine HCl 60 MG Oral Capsule Delayed Release Particles] 30 capsule 0    Sig: TAKE 1 CAPSULE BY MOUTH ONCE DAILY . APPOINTMENT REQUIRED FOR FUTURE REFILLS     Psychiatry: Antidepressants - SNRI Failed - 05/28/2019  2:10 PM      Failed - Valid encounter within last 6 months    Recent Outpatient Visits          1 week ago [place], [place], DO   1 month ago Chronic neuropathic pain   [place] [facility], Lanelle Bal, DO   6 months ago Sciatic neuropathy, right   [facility], Lanelle Bal, DO   9 months ago Spinal stenosis of lumbar region with neurogenic claudication   [facility], [place], DO   12 months ago Gout, unspecified cause, unspecified chronicity, unspecified site   [facility] [place], DO             Failed - Completed PHQ-2 or PHQ-9 in the last 360 days.      Passed - Last BP in normal range    BP Readings from Last 1 Encounters:  11/21/18 118/77

## 2019-06-03 ENCOUNTER — Ambulatory Visit: Payer: Medicare Other | Admitting: Osteopathic Medicine

## 2019-06-12 ENCOUNTER — Other Ambulatory Visit: Payer: Self-pay

## 2019-06-12 ENCOUNTER — Ambulatory Visit (INDEPENDENT_AMBULATORY_CARE_PROVIDER_SITE_OTHER): Payer: Medicare Other | Admitting: Osteopathic Medicine

## 2019-06-12 ENCOUNTER — Encounter: Payer: Self-pay | Admitting: Osteopathic Medicine

## 2019-06-12 VITALS — BP 119/64 | HR 84 | Temp 98.1°F | Wt 235.1 lb

## 2019-06-12 DIAGNOSIS — G8929 Other chronic pain: Secondary | ICD-10-CM

## 2019-06-12 DIAGNOSIS — K5900 Constipation, unspecified: Secondary | ICD-10-CM

## 2019-06-12 DIAGNOSIS — M48062 Spinal stenosis, lumbar region with neurogenic claudication: Secondary | ICD-10-CM | POA: Diagnosis not present

## 2019-06-12 DIAGNOSIS — M792 Neuralgia and neuritis, unspecified: Secondary | ICD-10-CM

## 2019-06-12 DIAGNOSIS — K2100 Gastro-esophageal reflux disease with esophagitis, without bleeding: Secondary | ICD-10-CM

## 2019-06-12 MED ORDER — PANTOPRAZOLE SODIUM 40 MG PO TBEC: 40.0000 mg | DELAYED_RELEASE_TABLET | Freq: Every day | ORAL | 0 refills | Status: DC

## 2019-06-12 NOTE — Patient Instructions (Addendum)
ACID REFLUX TREATMENT - I sent antacid to take for 2-3 months, can take Tums as needed with this. If not helping, or if symptoms come back when we stop this medicine, will consider GI referral     CONSTIPATION TREATMENT - Treatment for constipation includes changing some behaviors, eating foods high in fiber, and using laxatives or enemas if needed.  You can try these treatments at home, before seeing a healthcare provider. However, if you do not have a bowel movement within a few days, you should call your healthcare provider for further assistance.   Behavior changes - The bowels are most active following meals, and this is often the time when stools will pass most readily. If you ignore your body's signals to have a bowel movement, the signals become weaker and weaker over time. By paying close attention to these signals, you may have an easier time moving your bowels. Drinking a caffeine-containing beverage in the morning may also be helpful.  Increase fiber - Increasing fiber in your diet may reduce or eliminate constipation. The recommended amount of dietary fiber is 20 to 35 grams of fiber per day. Fiber side effects - Consuming large amounts of fiber can cause abdominal bloating or gas; this can be minimized by starting with a small amount and slowly increasing until stools become softer and more frequent.   LAXATIVES - If behavior changes and increasing fiber does not relieve your constipation, you may try taking a laxative. A variety of laxatives are available for treating constipation. The choice between them is based upon how they work, how safe the treatment is, and your healthcare provider's preferences. In general, laxatives can be categorized into the following groups:  Bulk forming laxatives - These include natural fiber and commercial fiber preparations such as: ?Psyllium (Konsyl; Metamucil; Perdiem) ?Methylcellulose (Citrucel) ?Calcium polycarbophil (FiberCon; Fiber-Lax;  Mitrolan) ?Wheat dextrin (Benefiber) You should increase the dose of fiber supplements slowly to prevent gas and cramping, and you should always take the supplement with plenty of fluid.  Hyperosmolar laxatives - Hyperosmolar laxatives include: Polyethylene glycol (MiraLax, Glycolax), which is generally preferred since it does not cause gas or bloating and is available in the [place] without a prescription. OK to take daily if needed.   Saline laxatives - Saline laxatives such as magnesium hydroxide (Milk of Magnesia) and magnesium citrate (Evac-Q-Mag) act similarly to the hyperosmolar laxatives.  Stimulant laxatives - Stimulant laxatives include senna (eg, Black Draught, Ex-lax, Fletcher's, Castoria, Senokot) and bisacodyl (eg, Correctol, Doxidan, Dulcolax). Some people overuse stimulant laxatives. Taking stimulant laxatives regularly or in large amounts can cause side effects, including low potassium levels.   New treatments - Lubiprostone (Amitiza) is a prescription medication that treats severe constipation. It is expensive, but it may be recommended if you do not respond to other treatments. Linaclotide (Linzess) is another prescription medication that has been approved for the treatment of chronic constipation. It is also an expensive medication as compared with other agents with the exception of lubiprostone. Linaclotide may be recommended if you do not respond to other treatments.  Constipation treatments to avoid ?Emollients - Emollient laxatives, principally mineral oil, soften stools by moisturizing them. However, other treatments have fewer risks and equal benefit. ?Natural products - A wide variety of natural products are advertised for constipation. Some of them contain the active ingredients found in commercially available laxatives. However, their dose and purity may not be carefully controlled. Thus, these products are not generally recommended.

## 2019-06-12 NOTE — Progress Notes (Signed)
I personally was present and performed or re-performed the history, physical exam and medical decision-making activities of this service and have verified that the service and findings are accurately documented in the student's note.   

## 2019-06-12 NOTE — Progress Notes (Signed)
HPI: Bobby Shaw is a 43 y.o. male who  has a past medical history of Alcohol abuse, Chronic diarrhea, Depression with anxiety, Hypertension, LFT elevation, Lung collapse (2001), Murmur, and Seizure (HCC).  he [facility] to [facility] today, 06/12/19,  for chief complaint of: indigestion, physical therapy referral for leg   Heart burn and ondigestion  . Location: Begins in the stomach and travels up the chest . Quality: burning discomfort  . Severity: 2-3/10, hasn't worsened recently . Duration: 416mos-1 yr . Timing: Throughout the day . Modifying factors: Tums, up to 9 pills/day alleviates pain until it come back. Notices that tomato sauce can aggravate pain  . Assoc signs/symptoms: Reports recent constipation. Will go a few days without having a BM. Denies n/v, abdominal pain, chest pain,  diarrhea.   Physical Therapy Referral - Patient would also like a referral to physical therapy for further strengthening of leg s/p compartment syndrome. Although he is able to walk, will intermittently use a cane for ambulation. Patient last fell a year ago.  At today's visit 06/12/19 ... PMH, PSH, FH reviewed and updated as needed.  Current medication list and allergy/intolerance hx reviewed and updated as needed. (See remainder of HPI, ROS, Phys Exam below)   No results found.  No results found for this or any previous visit (from the past 72 hour(s)).        ASSESSMENT/PLAN: The primary encounter diagnosis was GERD with esophagitis. A diagnosis of Constipation, unspecified constipation type was also pertinent to this visit.   Given chronic indigestion and heart burn, will start patient on a 3 month trial of pantoprazole 40 mg qday for acid reflux. If symptoms persist or if there is breakthrough pain, will have patient return and consider referral to GI for endoscopy to evaluate for ulcers or other etiology of reflux  Will send referral to physical  therapy   Counseled patient on over the counter treatments for constipation   Orders Placed This Encounter  Procedures  . Ambulatory referral to Physical Therapy     Meds ordered this encounter  Medications  . pantoprazole (PROTONIX) 40 MG tablet    Sig: Take 1 tablet (40 mg total) by mouth daily.    Dispense:  90 tablet    Refill:  0    Patient Instructions  ACID REFLUX TREATMENT - I sent antacid to take for 2-3 months, can take Tums as needed with this. If not helping, or if symptoms come back when we stop this medicine, will consider GI referral     CONSTIPATION TREATMENT - Treatment for constipation includes changing some behaviors, eating foods high in fiber, and using laxatives or enemas if needed.  You can try these treatments at home, before seeing a healthcare provider. However, if you do not have a bowel movement within a few days, you should call your healthcare provider for further assistance.   Behavior changes - The bowels are most active following meals, and this is often the time when stools will pass most readily. If you ignore your body's signals to have a bowel movement, the signals become weaker and weaker over time. By paying close attention to these signals, you may have an easier time moving your bowels. Drinking a caffeine-containing beverage in the morning may also be helpful.  Increase fiber - Increasing fiber in your diet may reduce or eliminate constipation. The recommended amount of dietary fiber is 20 to 35 grams of fiber per day. Fiber side effects -  Consuming large amounts of fiber can cause abdominal bloating or gas; this can be minimized by starting with a small amount and slowly increasing until stools become softer and more frequent.   LAXATIVES - If behavior changes and increasing fiber does not relieve your constipation, you may try taking a laxative. A variety of laxatives are available for treating constipation. The choice between them is based  upon how they work, how safe the treatment is, and your healthcare provider's preferences. In general, laxatives can be categorized into the following groups:  Bulk forming laxatives - These include natural fiber and commercial fiber preparations such as: ?Psyllium (Konsyl; Metamucil; Perdiem) ?Methylcellulose (Citrucel) ?Calcium polycarbophil (FiberCon; Fiber-Lax; Mitrolan) ?Wheat dextrin (Benefiber) You should increase the dose of fiber supplements slowly to prevent gas and cramping, and you should always take the supplement with plenty of fluid.  Hyperosmolar laxatives - Hyperosmolar laxatives include: Polyethylene glycol (MiraLax, Glycolax), which is generally preferred since it does not cause gas or bloating and is available in the [place] without a prescription. OK to take daily if needed.   Saline laxatives - Saline laxatives such as magnesium hydroxide (Milk of Magnesia) and magnesium citrate (Evac-Q-Mag) act similarly to the hyperosmolar laxatives.  Stimulant laxatives - Stimulant laxatives include senna (eg, Black Draught, Ex-lax, Fletcher's, Castoria, Senokot) and bisacodyl (eg, Correctol, Doxidan, Dulcolax). Some people overuse stimulant laxatives. Taking stimulant laxatives regularly or in large amounts can cause side effects, including low potassium levels.   New treatments - Lubiprostone (Amitiza) is a prescription medication that treats severe constipation. It is expensive, but it may be recommended if you do not respond to other treatments. Linaclotide (Linzess) is another prescription medication that has been approved for the treatment of chronic constipation. It is also an expensive medication as compared with other agents with the exception of lubiprostone. Linaclotide may be recommended if you do not respond to other treatments.  Constipation treatments to avoid ?Emollients - Emollient laxatives, principally mineral oil, soften stools by moisturizing them. However, other  treatments have fewer risks and equal benefit. ?Natural products - A wide variety of natural products are advertised for constipation. Some of them contain the active ingredients found in commercially available laxatives. However, their dose and purity may not be carefully controlled. Thus, these products are not generally recommended.       Follow-up plan: Return if symptoms worsen or fail to improve.                                                 ################################################# ################################################# ################################################# #################################################    Current Meds  Medication Sig  . allopurinol (ZYLOPRIM) 100 MG tablet Take 2 tablets (200 mg total) by mouth daily.  Marland Kitchen amitriptyline (ELAVIL) 50 MG tablet Take 1 tablet (50 mg total) by mouth at bedtime.  Marland Kitchen atorvastatin (LIPITOR) 20 MG tablet Take 1 tablet by mouth once daily  . b complex vitamins capsule Take 1 capsule by mouth daily.  . Buprenorphine HCl-Naloxone HCl (SUBOXONE SL) Place 8 mg under the tongue 2 (two) times daily.   . cholecalciferol (VITAMIN D) 1000 units tablet Take 1,000 Units by mouth daily.  . Colchicine (MITIGARE) 0.6 MG CAPS Take 0.6 mg by mouth daily.  . DULoxetine (CYMBALTA) 60 MG capsule TAKE 1 CAPSULE BY MOUTH ONCE DAILY . APPOINTMENT REQUIRED FOR FUTURE REFILLS  . gabapentin (NEURONTIN) 400  MG capsule TAKE 2 CAPSULES BY MOUTH THREE TIMES DAILY  . ibuprofen (ADVIL) 800 MG tablet Take 1 tablet (800 mg total) by mouth every 8 (eight) hours as needed.  Marland Kitchen losartan (COZAAR) 100 MG tablet Take 1 tablet by mouth once daily  . QUEtiapine (SEROQUEL) 50 MG tablet Take 1 tablet (50 mg total) by mouth at bedtime.    No Known Allergies     Review of Systems:  Constitutional: No recent illness  HEENT: No  headache, no vision change  Cardiac: No  chest  pain  Respiratory:  No  shortness of breath  Gastrointestinal: No  abdominal pain. No n/v. Constipation.   Musculoskeletal: No new myalgia/arthralgia  Neurologic: No  weakness, No  Dizziness  Psychiatric: No  concerns with depression, No  concerns with anxiety  Exam:  BP 119/64 (BP Location: Left Arm, Patient Position: Sitting, Cuff Size: Normal)   Pulse 84   Temp 98.1 F (36.7 C) (Oral)   Wt 235 lb 1.6 oz (106.6 kg)   BMI 34.72 kg/m   Constitutional: VS see above. General Appearance: alert, well-developed, well-nourished, NAD  Eyes: Normal lids and conjunctive, non-icteric sclera  Neck: No masses, trachea midline.   Respiratory: Normal respiratory effort.   Musculoskeletal: Slight limp towards left side.   Abdominal: non-tender, non-distended, no appreciable organomegaly  Neurological: Normal balance/coordination. No tremor.  Skin: warm, dry, intact.   Psychiatric: Normal judgment/insight. Normal mood and affect. Oriented x3.       Visit summary with medication list and pertinent instructions was printed for patient to review, patient was advised to alert [place] if any updates are needed. All questions at time of visit were answered - patient instructed to contact office with any additional concerns. ER/RTC precautions were reviewed with the patient and understanding verbalized.      Please note: voice recognition software was used to produce this document, and typos may escape review. Please contact Dr. Lyn Hollingshead for any needed clarifications.    Follow up plan: Return if symptoms worsen or fail to improve.

## 2019-06-17 ENCOUNTER — Telehealth: Payer: Self-pay

## 2019-06-17 NOTE — Telephone Encounter (Addendum)
Pt left a vm msg stating he got a call from a [facility] physical therapy rehab office. As per pt, does not want go to that particular office. Pt requesting to go to [facility] specialist for his physical therapy at [phone]. Pt was seen there before. Thanks.

## 2019-06-18 NOTE — Telephone Encounter (Signed)
Changing referral to [facility] and faxing as patient request - CF

## 2019-06-24 ENCOUNTER — Encounter: Payer: Self-pay | Admitting: Sports Medicine

## 2019-06-24 ENCOUNTER — Ambulatory Visit (INDEPENDENT_AMBULATORY_CARE_PROVIDER_SITE_OTHER): Payer: Medicare Other | Admitting: Sports Medicine

## 2019-06-24 ENCOUNTER — Other Ambulatory Visit: Payer: Self-pay

## 2019-06-24 ENCOUNTER — Ambulatory Visit (INDEPENDENT_AMBULATORY_CARE_PROVIDER_SITE_OTHER): Payer: Medicare Other

## 2019-06-24 DIAGNOSIS — S92511A Displaced fracture of proximal phalanx of right lesser toe(s), initial encounter for closed fracture: Secondary | ICD-10-CM | POA: Diagnosis not present

## 2019-06-24 DIAGNOSIS — M25474 Effusion, right foot: Secondary | ICD-10-CM

## 2019-06-24 DIAGNOSIS — S92501A Displaced unspecified fracture of right lesser toe(s), initial encounter for closed fracture: Secondary | ICD-10-CM | POA: Insufficient documentation

## 2019-06-24 MED ORDER — KETOROLAC TROMETHAMINE 30 MG/ML IJ SOLN
30.0000 mg | Freq: Once | INTRAMUSCULAR | Status: AC
  Administered 2019-06-24: 14:00:00 30 mg via INTRAMUSCULAR

## 2019-06-24 MED ORDER — PREDNISONE 50 MG PO TABS: ORAL_TABLET | ORAL | 0 refills | Status: DC

## 2019-06-24 MED ORDER — DOXYCYCLINE HYCLATE 100 MG PO TABS: 100.0000 mg | ORAL_TABLET | Freq: Two times a day (BID) | ORAL | 0 refills | Status: DC

## 2019-06-24 NOTE — Addendum Note (Signed)
Addended by: Silverio Decamp on: 06/24/2019 05:04 PM   Modules accepted: Orders
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
## 2019-06-24 NOTE — Assessment & Plan Note (Addendum)
Severe right foot swelling at the third MTP, no known trauma. 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 Toradol 30 intramuscular in the office. X-rays, prednisone, doxycycline. Return to see me in 2 weeks.  There is a fracture in the right third proximal phalanx, this is the likely cause of the pain.  Do not take the prednisone, do not take the doxycycline, he is going to need a boot.

## 2019-06-24 NOTE — Addendum Note (Signed)
Addended by: Beatris Ship L on: 06/24/2019 01:49 PM   Modules accepted: Orders

## 2019-06-25 ENCOUNTER — Ambulatory Visit (INDEPENDENT_AMBULATORY_CARE_PROVIDER_SITE_OTHER): Payer: Medicare Other | Admitting: Sports Medicine

## 2019-06-25 VITALS — BP 134/79 | HR 88 | Temp 98.4°F | Wt 236.0 lb

## 2019-06-25 DIAGNOSIS — S92501A Displaced unspecified fracture of right lesser toe(s), initial encounter for closed fracture: Secondary | ICD-10-CM

## 2019-06-25 NOTE — Progress Notes (Signed)
Pt in office today to receive a cam boot . Pt fitted and boot placed on right foot. Paperwork completed. Pt was advised to keep 2 week follow up.

## 2019-06-26 ENCOUNTER — Other Ambulatory Visit: Payer: Self-pay | Admitting: Osteopathic Medicine

## 2019-06-26 DIAGNOSIS — G5701 Lesion of sciatic nerve, right lower limb: Secondary | ICD-10-CM

## 2019-07-02 DIAGNOSIS — M545 Low back pain: Secondary | ICD-10-CM | POA: Diagnosis not present

## 2019-07-02 DIAGNOSIS — M79651 Pain in right thigh: Secondary | ICD-10-CM | POA: Diagnosis not present

## 2019-07-02 DIAGNOSIS — M25571 Pain in right ankle and joints of right foot: Secondary | ICD-10-CM | POA: Diagnosis not present

## 2019-07-04 DIAGNOSIS — M545 Low back pain: Secondary | ICD-10-CM | POA: Diagnosis not present

## 2019-07-04 DIAGNOSIS — M25571 Pain in right ankle and joints of right foot: Secondary | ICD-10-CM | POA: Diagnosis not present

## 2019-07-04 DIAGNOSIS — M79651 Pain in right thigh: Secondary | ICD-10-CM | POA: Diagnosis not present

## 2019-07-07 DIAGNOSIS — M25571 Pain in right ankle and joints of right foot: Secondary | ICD-10-CM | POA: Diagnosis not present

## 2019-07-07 DIAGNOSIS — M545 Low back pain: Secondary | ICD-10-CM | POA: Diagnosis not present

## 2019-07-07 DIAGNOSIS — M79651 Pain in right thigh: Secondary | ICD-10-CM | POA: Diagnosis not present

## 2019-07-08 DIAGNOSIS — M25571 Pain in right ankle and joints of right foot: Secondary | ICD-10-CM | POA: Diagnosis not present

## 2019-07-08 DIAGNOSIS — M79651 Pain in right thigh: Secondary | ICD-10-CM | POA: Diagnosis not present

## 2019-07-08 DIAGNOSIS — M545 Low back pain: Secondary | ICD-10-CM | POA: Diagnosis not present

## 2019-07-09 ENCOUNTER — Ambulatory Visit (INDEPENDENT_AMBULATORY_CARE_PROVIDER_SITE_OTHER): Payer: Medicare Other

## 2019-07-09 ENCOUNTER — Ambulatory Visit (INDEPENDENT_AMBULATORY_CARE_PROVIDER_SITE_OTHER): Payer: Medicare Other | Admitting: Sports Medicine

## 2019-07-09 ENCOUNTER — Encounter: Payer: Self-pay | Admitting: Sports Medicine

## 2019-07-09 ENCOUNTER — Other Ambulatory Visit: Payer: Self-pay

## 2019-07-09 ENCOUNTER — Other Ambulatory Visit: Payer: Self-pay | Admitting: Osteopathic Medicine

## 2019-07-09 DIAGNOSIS — G8929 Other chronic pain: Secondary | ICD-10-CM

## 2019-07-09 DIAGNOSIS — S92501D Displaced unspecified fracture of right lesser toe(s), subsequent encounter for fracture with routine healing: Secondary | ICD-10-CM | POA: Diagnosis not present

## 2019-07-09 DIAGNOSIS — S92511A Displaced fracture of proximal phalanx of right lesser toe(s), initial encounter for closed fracture: Secondary | ICD-10-CM | POA: Diagnosis not present
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
## 2019-07-09 NOTE — Assessment & Plan Note (Signed)
Doing well 2 weeks post fracture, swelling is improved, still has significant pain. Repeat x-rays today, continue boot for 1 month. Handicap placard order given. Return in a month.

## 2019-07-14 DIAGNOSIS — M545 Low back pain: Secondary | ICD-10-CM | POA: Diagnosis not present

## 2019-07-14 DIAGNOSIS — M25571 Pain in right ankle and joints of right foot: Secondary | ICD-10-CM | POA: Diagnosis not present

## 2019-07-14 DIAGNOSIS — M79651 Pain in right thigh: Secondary | ICD-10-CM | POA: Diagnosis not present

## 2019-07-16 DIAGNOSIS — M545 Low back pain: Secondary | ICD-10-CM | POA: Diagnosis not present

## 2019-07-16 DIAGNOSIS — M79651 Pain in right thigh: Secondary | ICD-10-CM | POA: Diagnosis not present

## 2019-07-16 DIAGNOSIS — M25571 Pain in right ankle and joints of right foot: Secondary | ICD-10-CM | POA: Diagnosis not present

## 2019-07-25 DIAGNOSIS — M25571 Pain in right ankle and joints of right foot: Secondary | ICD-10-CM | POA: Diagnosis not present

## 2019-07-25 DIAGNOSIS — M545 Low back pain: Secondary | ICD-10-CM | POA: Diagnosis not present

## 2019-07-25 DIAGNOSIS — M79651 Pain in right thigh: Secondary | ICD-10-CM | POA: Diagnosis not present

## 2019-08-06 ENCOUNTER — Ambulatory Visit: Payer: Medicare Other | Admitting: Sports Medicine

## 2019-08-08 ENCOUNTER — Ambulatory Visit (INDEPENDENT_AMBULATORY_CARE_PROVIDER_SITE_OTHER): Payer: Medicare Other | Admitting: Sports Medicine

## 2019-08-08 ENCOUNTER — Encounter: Payer: Self-pay | Admitting: Sports Medicine

## 2019-08-08 ENCOUNTER — Other Ambulatory Visit: Payer: Self-pay

## 2019-08-08 DIAGNOSIS — S92501D Displaced unspecified fracture of right lesser toe(s), subsequent encounter for fracture with routine healing: Secondary | ICD-10-CM | POA: Diagnosis not present

## 2019-08-08 NOTE — Assessment & Plan Note (Signed)
6 weeks post fracture, doing extremely well, discontinue boot, return as needed.
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
## 2019-08-11 ENCOUNTER — Telehealth: Payer: Self-pay

## 2019-08-11 DIAGNOSIS — N5089 Other specified disorders of the male genital organs: Secondary | ICD-10-CM

## 2019-08-11 DIAGNOSIS — G4700 Insomnia, unspecified: Secondary | ICD-10-CM

## 2019-08-11 DIAGNOSIS — F39 Unspecified mood [affective] disorder: Secondary | ICD-10-CM

## 2019-08-11 DIAGNOSIS — F1021 Alcohol dependence, in remission: Secondary | ICD-10-CM

## 2019-08-11 NOTE — Telephone Encounter (Signed)
Pt left a vm msg requesting referrals for Urologist and Mental Health. As per pt, he is aware he has missed several appt and does apologizes. He's dealing with some issues at this time.

## 2019-08-12 NOTE — Telephone Encounter (Signed)
Can he clarify what he needs to see urologist about? Referral order will require a specific concern/diagnosis.

## 2019-08-12 NOTE — Telephone Encounter (Signed)
Spoke to pt regarding referral requests. As per pt, wants to go [place] [phone]). For the Urologist referral, pt stated he found a dime sized painful lump in his testicle area a few days ago. Pt stated that he does have a piercing in the area. Unaware if pain if associated with piercing. Preferred location for Urologist is [facility] or [facility]. No other inquires during call.

## 2019-08-14 NOTE — Telephone Encounter (Signed)
Referrals placed If he needs evaluatino for testicular mass/pain sooner, he should see one of [place] in office or UC to confirm/rule out something serious or urgent such as infection, cyst. Does't sound like an emergency but cannot say for sure based on his description.

## 2019-08-15 NOTE — Telephone Encounter (Signed)
Patient advised. Agreeable to evaluation if mass/pain needs sooner attention.

## 2019-08-16 ENCOUNTER — Other Ambulatory Visit: Payer: Self-pay | Admitting: Osteopathic Medicine

## 2019-08-16 DIAGNOSIS — I10 Essential (primary) hypertension: Secondary | ICD-10-CM

## 2019-08-18 DIAGNOSIS — M79651 Pain in right thigh: Secondary | ICD-10-CM | POA: Diagnosis not present

## 2019-08-18 DIAGNOSIS — M25571 Pain in right ankle and joints of right foot: Secondary | ICD-10-CM | POA: Diagnosis not present

## 2019-08-18 DIAGNOSIS — M545 Low back pain: Secondary | ICD-10-CM | POA: Diagnosis not present

## 2019-08-20 DIAGNOSIS — M545 Low back pain: Secondary | ICD-10-CM | POA: Diagnosis not present

## 2019-08-20 DIAGNOSIS — M25571 Pain in right ankle and joints of right foot: Secondary | ICD-10-CM | POA: Diagnosis not present

## 2019-08-20 DIAGNOSIS — M79651 Pain in right thigh: Secondary | ICD-10-CM | POA: Diagnosis not present

## 2019-08-25 DIAGNOSIS — M79651 Pain in right thigh: Secondary | ICD-10-CM | POA: Diagnosis not present

## 2019-08-25 DIAGNOSIS — M545 Low back pain: Secondary | ICD-10-CM | POA: Diagnosis not present

## 2019-08-25 DIAGNOSIS — M25571 Pain in right ankle and joints of right foot: Secondary | ICD-10-CM | POA: Diagnosis not present

## 2019-08-27 DIAGNOSIS — M79651 Pain in right thigh: Secondary | ICD-10-CM | POA: Diagnosis not present

## 2019-08-27 DIAGNOSIS — M25571 Pain in right ankle and joints of right foot: Secondary | ICD-10-CM | POA: Diagnosis not present

## 2019-08-27 DIAGNOSIS — M545 Low back pain: Secondary | ICD-10-CM | POA: Diagnosis not present

## 2019-08-29 DIAGNOSIS — M545 Low back pain: Secondary | ICD-10-CM | POA: Diagnosis not present

## 2019-08-29 DIAGNOSIS — M79651 Pain in right thigh: Secondary | ICD-10-CM | POA: Diagnosis not present

## 2019-08-29 DIAGNOSIS — M25571 Pain in right ankle and joints of right foot: Secondary | ICD-10-CM | POA: Diagnosis not present

## 2019-09-01 DIAGNOSIS — M545 Low back pain: Secondary | ICD-10-CM | POA: Diagnosis not present

## 2019-09-01 DIAGNOSIS — M25571 Pain in right ankle and joints of right foot: Secondary | ICD-10-CM | POA: Diagnosis not present

## 2019-09-01 DIAGNOSIS — M79651 Pain in right thigh: Secondary | ICD-10-CM | POA: Diagnosis not present

## 2019-09-03 DIAGNOSIS — M25571 Pain in right ankle and joints of right foot: Secondary | ICD-10-CM | POA: Diagnosis not present

## 2019-09-03 DIAGNOSIS — M79651 Pain in right thigh: Secondary | ICD-10-CM | POA: Diagnosis not present

## 2019-09-03 DIAGNOSIS — M545 Low back pain: Secondary | ICD-10-CM | POA: Diagnosis not present

## 2019-09-05 DIAGNOSIS — M79651 Pain in right thigh: Secondary | ICD-10-CM | POA: Diagnosis not present

## 2019-09-05 DIAGNOSIS — M545 Low back pain: Secondary | ICD-10-CM | POA: Diagnosis not present

## 2019-09-05 DIAGNOSIS — M25571 Pain in right ankle and joints of right foot: Secondary | ICD-10-CM | POA: Diagnosis not present

## 2019-09-08 DIAGNOSIS — M545 Low back pain: Secondary | ICD-10-CM | POA: Diagnosis not present

## 2019-09-08 DIAGNOSIS — M79651 Pain in right thigh: Secondary | ICD-10-CM | POA: Diagnosis not present

## 2019-09-08 DIAGNOSIS — M25571 Pain in right ankle and joints of right foot: Secondary | ICD-10-CM | POA: Diagnosis not present

## 2019-09-18 ENCOUNTER — Other Ambulatory Visit: Payer: Self-pay | Admitting: Osteopathic Medicine

## 2019-09-18 NOTE — Telephone Encounter (Signed)
Forwarding medication refill requests to the clinical pool for review. 

## 2019-10-08 ENCOUNTER — Telehealth: Payer: Self-pay

## 2019-10-08 NOTE — Telephone Encounter (Signed)
As he requested for referral does not take referral requests from [facility] practices.  I can route this to [facility] and see if there is anywhere else that he can be sent other than downstairs, patient can also contact his insurance and see what might be a good option for him.

## 2019-10-08 NOTE — Telephone Encounter (Signed)
Bobby Shaw called and left a message stating he never heard back about the referral for Psychiatry. He states he called [facility] and they are not excepting referrals from [facility] or any other health system outside of [place]. He is wanting to get a referral to Psychiatry as soon as possible.

## 2019-10-09 NOTE — Telephone Encounter (Signed)
Sent referral to [facility] in [place] - CF

## 2019-10-10 NOTE — Telephone Encounter (Signed)
Received fax from [facility] and they do not accept patients insurance.  He is going to have to call and find out where he can go. We can not referral to [facility] or [facility]. I sent patient a mychart message letting him know the status of the referral - CF

## 2019-10-23 MED ORDER — HALOPERIDOL 5 MG PO TABS
5.00 | ORAL_TABLET | ORAL | Status: DC
Start: ? — End: 2019-10-23

## 2019-10-23 MED ORDER — BISACODYL 5 MG PO TBEC
5.00 | DELAYED_RELEASE_TABLET | ORAL | Status: DC
Start: ? — End: 2019-10-23

## 2019-10-23 MED ORDER — DULOXETINE HCL 60 MG PO CPEP
120.00 | ORAL_CAPSULE | ORAL | Status: DC
Start: 2019-10-24 — End: 2019-10-23

## 2019-10-23 MED ORDER — ALLOPURINOL 100 MG PO TABS
200.00 | ORAL_TABLET | ORAL | Status: DC
Start: 2019-10-24 — End: 2019-10-23

## 2019-10-23 MED ORDER — GABAPENTIN 100 MG PO CAPS
100.00 | ORAL_CAPSULE | ORAL | Status: DC
Start: ? — End: 2019-10-23

## 2019-10-23 MED ORDER — LOSARTAN POTASSIUM 100 MG PO TABS
100.00 | ORAL_TABLET | ORAL | Status: DC
Start: 2019-10-24 — End: 2019-10-23

## 2019-10-23 MED ORDER — QUETIAPINE FUMARATE 100 MG PO TABS
100.00 | ORAL_TABLET | ORAL | Status: DC
Start: 2019-10-23 — End: 2019-10-23

## 2019-10-23 MED ORDER — BUPRENORPHINE HCL 8 MG SL SUBL
8.00 | SUBLINGUAL_TABLET | SUBLINGUAL | Status: DC
Start: 2019-10-23 — End: 2019-10-23

## 2019-10-23 MED ORDER — DIPHENHYDRAMINE HCL 25 MG PO CAPS
25.00 | ORAL_CAPSULE | ORAL | Status: DC
Start: ? — End: 2019-10-23

## 2019-10-23 MED ORDER — HALOPERIDOL LACTATE 5 MG/ML IJ SOLN
5.00 | INTRAMUSCULAR | Status: DC
Start: ? — End: 2019-10-23

## 2019-10-23 MED ORDER — GENERIC EXTERNAL MEDICATION
Status: DC
Start: ? — End: 2019-10-23

## 2019-10-23 MED ORDER — ATORVASTATIN CALCIUM 20 MG PO TABS
20.00 | ORAL_TABLET | ORAL | Status: DC
Start: 2019-10-24 — End: 2019-10-23

## 2019-10-23 MED ORDER — ALUM & MAG HYDROXIDE-SIMETH 200-200-20 MG/5ML PO SUSP
30.00 | ORAL | Status: DC
Start: ? — End: 2019-10-23

## 2019-10-23 MED ORDER — NICOTINE 21 MG/24HR TD PT24
1.00 | MEDICATED_PATCH | TRANSDERMAL | Status: DC
Start: 2019-10-23 — End: 2019-10-23

## 2019-10-23 MED ORDER — PANTOPRAZOLE SODIUM 40 MG PO TBEC
40.00 | DELAYED_RELEASE_TABLET | ORAL | Status: DC
Start: 2019-10-24 — End: 2019-10-23

## 2019-10-23 MED ORDER — DIPHENHYDRAMINE HCL 50 MG/ML IJ SOLN
50.00 | INTRAMUSCULAR | Status: DC
Start: ? — End: 2019-10-23

## 2019-10-23 MED ORDER — ACETAMINOPHEN 325 MG PO TABS
650.00 | ORAL_TABLET | ORAL | Status: DC
Start: ? — End: 2019-10-23

## 2019-10-23 MED ORDER — HYDROXYZINE PAMOATE 50 MG PO CAPS
50.00 | ORAL_CAPSULE | ORAL | Status: DC
Start: ? — End: 2019-10-23

## 2019-10-27 ENCOUNTER — Telehealth: Payer: Self-pay | Admitting: Osteopathic Medicine

## 2019-10-27 NOTE — Telephone Encounter (Signed)
Can put in acute slot or if emergent can see NP

## 2019-10-27 NOTE — Telephone Encounter (Signed)
Patient overdosed on  (ELAVIL). Patient is being discharged tomorrow and needs a hospital follow up. Nothing open in appropriate  spot. Please advise.

## 2019-10-28 NOTE — Telephone Encounter (Signed)
Left voicemail with information below. Let Florentina Addison know to call us back to schedule an appointment.

## 2019-11-11 ENCOUNTER — Ambulatory Visit: Payer: Medicare Other | Admitting: Osteopathic Medicine

## 2019-11-20 ENCOUNTER — Ambulatory Visit (INDEPENDENT_AMBULATORY_CARE_PROVIDER_SITE_OTHER): Payer: Medicare Other | Admitting: Osteopathic Medicine

## 2019-11-20 ENCOUNTER — Other Ambulatory Visit: Payer: Self-pay

## 2019-11-20 ENCOUNTER — Encounter: Payer: Self-pay | Admitting: Osteopathic Medicine

## 2019-11-20 VITALS — BP 133/79 | HR 78 | Temp 98.5°F | Wt 238.0 lb

## 2019-11-20 DIAGNOSIS — Z113 Encounter for screening for infections with a predominantly sexual mode of transmission: Secondary | ICD-10-CM

## 2019-11-20 DIAGNOSIS — F339 Major depressive disorder, recurrent, unspecified: Secondary | ICD-10-CM | POA: Diagnosis not present

## 2019-11-20 DIAGNOSIS — T50901A Poisoning by unspecified drugs, medicaments and biological substances, accidental (unintentional), initial encounter: Secondary | ICD-10-CM | POA: Insufficient documentation

## 2019-11-20 DIAGNOSIS — T50904D Poisoning by unspecified drugs, medicaments and biological substances, undetermined, subsequent encounter: Secondary | ICD-10-CM

## 2019-11-20 DIAGNOSIS — F329 Major depressive disorder, single episode, unspecified: Secondary | ICD-10-CM

## 2019-11-20 HISTORY — DX: Major depressive disorder, single episode, unspecified: F32.9

## 2019-11-20 NOTE — Patient Instructions (Signed)
Plan: Will reach out to psychiatry at [facility] re: referral  STD testing today Consider freezing lesions, can also Rx Imiquimod cream

## 2019-11-20 NOTE — Progress Notes (Signed)
Bobby Shaw is a 44 y.o. male who presents to  [place] at [facility]  today, 11/20/19, seeking care for the following: Marland Kitchen Medication management: depression, insomnia      ASSESSMENT & PLAN with other pertinent history/findings:  The primary encounter diagnosis was Routine screening for STI (sexually transmitted infection). Diagnoses of Recurrent major depressive disorder, remission status unspecified ([place]) and Drug overdose, undetermined intent, subsequent encounter were also pertinent to this visit.   Recoreds reviewed: D/C summary 10/28/19 admission for psychiatric care to [facility]. LOS 5 days. New meds: Nicoderm, Trazodone 200 mg qhs, increased Cymbalta to 120 mg daily, Gabapentin 100 mg tid. No change to Allopurinol 200 mg, Lipitor 20, Suboxone, Losartan 100, Protonix 40. STOPPED Elavil, Seroquel.   We previously referred him to psychiatry but they will not accept referral from non-[facility] providers. On admin time tomorrow, I'll reach out to this office and see if they'd be so kind as to make an exception, or I can suggest the ER/hospital refer him in the future  Pt otherwise doing well   Requests STD testing, has small bump on base of shaft, exam ?benign nevus vs wart, offered cryotherapy and he declined, encouraged abstinence/inform partner if needed until this is treated as condoms may not cover this area     Patient Instructions  Plan: Will reach out to psychiatry at [facility] re: referral  STD testing today Consider freezing lesions, can also Rx Imiquimod cream      Orders Placed This Encounter  Procedures  . C. trachomatis/N. gonorrhoeae RNA  . Trichomonas vaginalis, RNA  . Hepatitis B core antibody, total  . Hepatitis B surface antigen  . Hepatitis C antibody, reflex  . HIV Antibody (routine testing w rflx)  . RPR  . HSV(herpes simplex vrs) 1+2 ab-IgG    No orders of the defined types were placed in this  encounter.      Follow-up instructions: Return for VISIT WITH SPORTS MEDICINE FOR ORTHOPEDIC ISSUE.                                         BP 133/79 (BP Location: Left Arm, Patient Position: Sitting, Cuff Size: Normal)   Pulse 78   Temp 98.5 F (36.9 C) (Oral)   Wt 238 lb (108 kg)   BMI 32.28 kg/m   Current Meds  Medication Sig  . allopurinol (ZYLOPRIM) 100 MG tablet Take 2 tablets by mouth once daily  . amitriptyline (ELAVIL) 50 MG tablet Take 1 tablet (50 mg total) by mouth at bedtime.  Marland Kitchen atorvastatin (LIPITOR) 20 MG tablet Take 1 tablet by mouth once daily  . b complex vitamins capsule Take 1 capsule by mouth daily.  . B Complex-Biotin-FA (SUPER B-50 B COMPLEX) CAPS Take by mouth.  . Buprenorphine HCl-Naloxone HCl (SUBOXONE SL) Place 8 mg under the tongue 2 (two) times daily.   . cholecalciferol (VITAMIN D) 1000 units tablet Take 1,000 Units by mouth daily.  . Colchicine (MITIGARE) 0.6 MG CAPS Take 0.6 mg by mouth daily.  . DULoxetine (CYMBALTA) 60 MG capsule TAKE 1 CAPSULE BY MOUTH ONCE DAILY . APPOINTMENT REQUIRED FOR FUTURE REFILLS  . gabapentin (NEURONTIN) 400 MG capsule TAKE 2 CAPSULES BY MOUTH THREE TIMES DAILY  . ibuprofen (ADVIL) 800 MG tablet Take 1 tablet (800 mg total) by mouth every 8 (eight) hours as needed.  Marland Kitchen  losartan (COZAAR) 100 MG tablet Take 1 tablet by mouth once daily  . pantoprazole (PROTONIX) 40 MG tablet Take 1 tablet by mouth once daily  . QUEtiapine (SEROQUEL) 50 MG tablet Take 1 tablet (50 mg total) by mouth at bedtime.    No results found for this or any previous visit (from the past 72 hour(s)).  No results found.  Depression screen [place] 2/9 06/12/2019 04/16/2019 11/21/2018  Decreased Interest 0 3 3  Down, Depressed, Hopeless 1 3 3   PHQ - 2 Score 1 6 6   Altered sleeping 3 3 3   Tired, decreased energy 1 2 3   Change in appetite 0 2 3  Feeling bad or failure about yourself  1 3 3   Trouble concentrating 0 3  2  Moving slowly or fidgety/restless 1 2 2   Suicidal thoughts 0 2 1  PHQ-9 Score 7 23 23   Difficult doing work/chores Somewhat difficult Somewhat difficult -    GAD 7 : Generalized Anxiety Score 06/12/2019 04/16/2019 11/21/2018 08/28/2018  Nervous, Anxious, on Edge 1 2 2 2   Control/stop worrying 1 2 2 3   Worry too much - different things 1 2 2 3   Trouble relaxing 1 2 3 2   Restless 1 1 1 1   Easily annoyed or irritable 1 2 2 1   Afraid - awful might happen 1 3 2 2   Total GAD 7 Score 7 14 14 14   Anxiety Difficulty Somewhat difficult Somewhat difficult Very difficult Very difficult      All questions at time of visit were answered - patient instructed to contact office with any additional concerns or updates.  ER/RTC precautions were reviewed with the patient.  Please note: voice recognition software was used to produce this document, and typos may escape review. Please contact Dr. for any needed clarifications.

## 2019-11-21 LAB — HIV ANTIBODY (ROUTINE TESTING W REFLEX): HIV 1&2 Ab, 4th Generation: NONREACTIVE

## 2019-11-21 LAB — HSV(HERPES SIMPLEX VRS) I + II AB-IGG
HAV 1 IGG,TYPE SPECIFIC AB: 11.2 index — ABNORMAL HIGH
HSV 2 IGG,TYPE SPECIFIC AB: 2.01 index — ABNORMAL HIGH

## 2019-11-21 LAB — C. TRACHOMATIS/N. GONORRHOEAE RNA
C. trachomatis RNA, TMA: NOT DETECTED
N. gonorrhoeae RNA, TMA: NOT DETECTED

## 2019-11-21 LAB — HEPATITIS B SURFACE ANTIGEN: Hepatitis B Surface Ag: NONREACTIVE

## 2019-11-21 LAB — HEPATITIS B CORE ANTIBODY, TOTAL: Hep B Core Total Ab: NONREACTIVE

## 2019-11-21 LAB — RPR: RPR Ser Ql: NONREACTIVE

## 2019-11-21 LAB — TRICHOMONAS VAGINALIS RNA, QL,MALES: Trichomonas vaginalis RNA: NOT DETECTED

## 2019-11-24 ENCOUNTER — Other Ambulatory Visit: Payer: Self-pay

## 2019-11-24 ENCOUNTER — Ambulatory Visit (INDEPENDENT_AMBULATORY_CARE_PROVIDER_SITE_OTHER): Payer: Medicare Other | Admitting: Sports Medicine

## 2019-11-24 DIAGNOSIS — M4807 Spinal stenosis, lumbosacral region: Secondary | ICD-10-CM

## 2019-11-24 DIAGNOSIS — M48062 Spinal stenosis, lumbar region with neurogenic claudication: Secondary | ICD-10-CM

## 2019-11-24 NOTE — Progress Notes (Signed)
    Procedures performed today:    None.  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 There is mild retrolisthesis of L5 on S1.  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 Spinal stenosis is worst from L3-L5, he has had extensive physical therapy, and 2 epidurals without significant improvement, pain is moderate, he has weakness in his legs and difficulty standing for long periods of time. He is also working with a pain management provider, he did not have a good response to a spinal cord stimulator and is currently considering an intrathecal pain pump. At this point I think is reasonable to get a second opinion from Dr. Yevette Edwards, I am going to go ahead and tee him up for a new lumbar spine MRI in the meantime.    ___________________________________________ Bobby Shaw. Bobby Shaw, M.D., ABFM., CAQSM. Primary Care and Sports Medicine [facility]  Adjunct Instructor of Family Medicine  [employer]

## 2019-11-24 NOTE — Assessment & Plan Note (Signed)
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 Spinal stenosis is worst from L3-L5, he has had extensive physical therapy, and 2 epidurals without significant improvement, pain is moderate, he has weakness in his legs and difficulty standing for long periods of time. He is also working with a pain management provider, he did not have a good response to a spinal cord stimulator and is currently considering an intrathecal pain pump. At this point I think is reasonable to get a second opinion from Dr. Yevette Edwards, I am going to go ahead and tee him up for a new lumbar spine MRI in the meantime.

## 2019-11-25 ENCOUNTER — Encounter: Payer: Self-pay | Admitting: Osteopathic Medicine

## 2019-12-01 ENCOUNTER — Other Ambulatory Visit: Payer: Medicare Other

## 2019-12-04 ENCOUNTER — Telehealth: Payer: Medicare Other | Admitting: Osteopathic Medicine

## 2019-12-05 ENCOUNTER — Other Ambulatory Visit: Payer: Self-pay | Admitting: Osteopathic Medicine

## 2019-12-05 DIAGNOSIS — I10 Essential (primary) hypertension: Secondary | ICD-10-CM

## 2019-12-05 NOTE — Telephone Encounter (Signed)
Requested  medications are  due for refill today yes  Requested medications are on the active medication list yes  Last refill 11/23  Future visit scheduled no

## 2019-12-20 ENCOUNTER — Other Ambulatory Visit: Payer: Self-pay | Admitting: Osteopathic Medicine

## 2019-12-20 DIAGNOSIS — G5701 Lesion of sciatic nerve, right lower limb: Secondary | ICD-10-CM

## 2019-12-20 NOTE — Telephone Encounter (Signed)
Requested  medications are  due for refill today yes  Requested medications are on the active medication list yes  Last refill 1/6  Last visit 11/20/2019  Future visit scheduled no  Notes to clinic (Office note from 2/25 states discharge summary has Cymbalta listed at 120mg )

## 2019-12-22 ENCOUNTER — Other Ambulatory Visit: Payer: Self-pay | Admitting: Osteopathic Medicine

## 2019-12-22 DIAGNOSIS — Z8659 Personal history of other mental and behavioral disorders: Secondary | ICD-10-CM

## 2019-12-22 DIAGNOSIS — G4709 Other insomnia: Secondary | ICD-10-CM

## 2019-12-22 NOTE — Telephone Encounter (Signed)
Sent pt mychart msg "Bobby Shaw, we received a fax from your pharmacy to refill your Seroquel. We just wanted to check and see did you ever get in with psychiatry? If so, are they now witting any medications for you? Let us know. "

## 2019-12-22 NOTE — Telephone Encounter (Signed)
Patient called again about refill on his  Cymbalta 120 mg.  Thank you.

## 2020-01-02 ENCOUNTER — Telehealth: Payer: Self-pay | Admitting: *Deleted

## 2020-01-02 DIAGNOSIS — G5701 Lesion of sciatic nerve, right lower limb: Secondary | ICD-10-CM

## 2020-01-02 NOTE — Telephone Encounter (Signed)
Patient left message stating that has been on 120mg  of duloxetine since his hospital discharge.  He would like a script sent to the pharmacy to reflect this dosage.  He only has a weeks worth of medication left. Will forward to Dr. cma  Mardelle Matte

## 2020-01-05 MED ORDER — DULOXETINE HCL 60 MG PO CPEP: 120.0000 mg | ORAL_CAPSULE | Freq: Every day | ORAL | 0 refills | Status: DC

## 2020-01-05 NOTE — Telephone Encounter (Signed)
Left a message for a return call. To confirm patient is needing a prescription for 160 mg Cymbalta.

## 2020-01-05 NOTE — Telephone Encounter (Signed)
Can increase Cymbalta RX Has he heard back yet about psychiatry referral?  THanks.

## 2020-01-05 NOTE — Telephone Encounter (Signed)
Routing to provider. Current rx listed as 60 mg/1 capsule daily. Pls send updated rx to pharmacy.

## 2020-01-22 ENCOUNTER — Other Ambulatory Visit: Payer: Self-pay | Admitting: Osteopathic Medicine

## 2020-01-22 DIAGNOSIS — G4709 Other insomnia: Secondary | ICD-10-CM

## 2020-01-22 DIAGNOSIS — Z8659 Personal history of other mental and behavioral disorders: Secondary | ICD-10-CM

## 2020-01-25 ENCOUNTER — Other Ambulatory Visit: Payer: Self-pay | Admitting: Osteopathic Medicine

## 2020-02-17 ENCOUNTER — Other Ambulatory Visit: Payer: Self-pay

## 2020-02-17 MED ORDER — ATORVASTATIN CALCIUM 20 MG PO TABS: 20.0000 mg | ORAL_TABLET | Freq: Every day | ORAL | 0 refills | Status: DC

## 2020-03-17 ENCOUNTER — Other Ambulatory Visit: Payer: Self-pay | Admitting: Osteopathic Medicine

## 2020-03-17 DIAGNOSIS — I10 Essential (primary) hypertension: Secondary | ICD-10-CM

## 2020-03-24 ENCOUNTER — Other Ambulatory Visit: Payer: Self-pay | Admitting: Osteopathic Medicine

## 2020-03-24 DIAGNOSIS — G5701 Lesion of sciatic nerve, right lower limb: Secondary | ICD-10-CM

## 2020-03-24 DIAGNOSIS — G4709 Other insomnia: Secondary | ICD-10-CM

## 2020-03-24 DIAGNOSIS — Z8659 Personal history of other mental and behavioral disorders: Secondary | ICD-10-CM

## 2020-03-26 ENCOUNTER — Other Ambulatory Visit: Payer: Self-pay | Admitting: Osteopathic Medicine

## 2020-03-26 ENCOUNTER — Telehealth: Payer: Self-pay | Admitting: Osteopathic Medicine

## 2020-03-26 DIAGNOSIS — Z8659 Personal history of other mental and behavioral disorders: Secondary | ICD-10-CM

## 2020-03-26 DIAGNOSIS — G4709 Other insomnia: Secondary | ICD-10-CM

## 2020-03-26 NOTE — Telephone Encounter (Signed)
Pt left vm. [facility] contacted [place] about his meds and he has still not gotten his refill on Seroquel.  Thanks.

## 2020-03-26 NOTE — Telephone Encounter (Signed)
Patient advised a 1 month refill has been sent and to schedule an appointment for additional refills.

## 2020-03-27 ENCOUNTER — Other Ambulatory Visit: Payer: Self-pay | Admitting: Osteopathic Medicine

## 2020-04-01 ENCOUNTER — Other Ambulatory Visit: Payer: Self-pay | Admitting: Osteopathic Medicine

## 2020-04-01 DIAGNOSIS — G5701 Lesion of sciatic nerve, right lower limb: Secondary | ICD-10-CM

## 2020-04-07 ENCOUNTER — Telehealth: Payer: Self-pay

## 2020-04-07 DIAGNOSIS — F39 Unspecified mood [affective] disorder: Secondary | ICD-10-CM

## 2020-04-07 NOTE — Telephone Encounter (Signed)
Ok for referral?

## 2020-04-07 NOTE — Telephone Encounter (Signed)
Patient is requesting a referral to psychiatry. Patient has had several referrals put in, he says he is ready to seek counsel and would like to see Behavioral health in this building. Please advise  If ok to put in another referral

## 2020-04-09 NOTE — Telephone Encounter (Signed)
Patient contacted and notified of referral placed to behavioral health.

## 2020-04-14 ENCOUNTER — Other Ambulatory Visit: Payer: Self-pay | Admitting: Osteopathic Medicine

## 2020-04-22 ENCOUNTER — Telehealth (INDEPENDENT_AMBULATORY_CARE_PROVIDER_SITE_OTHER): Payer: Medicare Other | Admitting: Osteopathic Medicine

## 2020-04-22 ENCOUNTER — Encounter: Payer: Self-pay | Admitting: Osteopathic Medicine

## 2020-04-22 VITALS — Wt 230.0 lb

## 2020-04-22 DIAGNOSIS — G4709 Other insomnia: Secondary | ICD-10-CM

## 2020-04-22 DIAGNOSIS — Z8659 Personal history of other mental and behavioral disorders: Secondary | ICD-10-CM

## 2020-04-22 DIAGNOSIS — G5701 Lesion of sciatic nerve, right lower limb: Secondary | ICD-10-CM | POA: Diagnosis not present

## 2020-04-22 MED ORDER — QUETIAPINE FUMARATE 50 MG PO TABS: 50.0000 mg | ORAL_TABLET | Freq: Every day | ORAL | 1 refills | Status: DC
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
## 2020-04-23 ENCOUNTER — Other Ambulatory Visit: Payer: Self-pay

## 2020-04-23 MED ORDER — ATORVASTATIN CALCIUM 20 MG PO TABS: ORAL_TABLET | ORAL | 0 refills | Status: DC

## 2020-04-29 ENCOUNTER — Telehealth (HOSPITAL_COMMUNITY): Payer: Medicare Other | Admitting: Psychiatry

## 2020-05-11 ENCOUNTER — Other Ambulatory Visit: Payer: Self-pay | Admitting: Osteopathic Medicine

## 2020-05-11 DIAGNOSIS — G8929 Other chronic pain: Secondary | ICD-10-CM

## 2020-05-11 NOTE — Telephone Encounter (Signed)
Pt received a 90 day supply for this medication on 07/09/2019 and has not had this medication refilled since.  Please review for appropriateness of refill and authorize if appropriate.  Tiajuana Amass, CMA

## 2020-05-18 ENCOUNTER — Encounter (HOSPITAL_COMMUNITY): Payer: Self-pay | Admitting: Psychiatry

## 2020-05-18 ENCOUNTER — Telehealth (INDEPENDENT_AMBULATORY_CARE_PROVIDER_SITE_OTHER): Payer: Medicare Other | Admitting: Psychiatry

## 2020-05-18 DIAGNOSIS — F1910 Other psychoactive substance abuse, uncomplicated: Secondary | ICD-10-CM | POA: Diagnosis not present

## 2020-05-18 DIAGNOSIS — M792 Neuralgia and neuritis, unspecified: Secondary | ICD-10-CM | POA: Diagnosis not present

## 2020-05-18 DIAGNOSIS — F3131 Bipolar disorder, current episode depressed, mild: Secondary | ICD-10-CM | POA: Diagnosis not present

## 2020-05-18 DIAGNOSIS — F1020 Alcohol dependence, uncomplicated: Secondary | ICD-10-CM | POA: Diagnosis not present

## 2020-05-18 DIAGNOSIS — F0630 Mood disorder due to known physiological condition, unspecified: Secondary | ICD-10-CM

## 2020-05-18 DIAGNOSIS — G8929 Other chronic pain: Secondary | ICD-10-CM
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
## 2020-05-27 ENCOUNTER — Ambulatory Visit (INDEPENDENT_AMBULATORY_CARE_PROVIDER_SITE_OTHER): Payer: Medicare Other | Admitting: Licensed Clinical Social Worker

## 2020-05-27 DIAGNOSIS — F3131 Bipolar disorder, current episode depressed, mild: Secondary | ICD-10-CM
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
## 2020-06-11 ENCOUNTER — Encounter: Payer: Self-pay | Admitting: Osteopathic Medicine

## 2020-06-21 ENCOUNTER — Ambulatory Visit (HOSPITAL_COMMUNITY): Payer: Medicare Other | Admitting: Licensed Clinical Social Worker

## 2020-06-21 ENCOUNTER — Telehealth (HOSPITAL_COMMUNITY): Payer: Self-pay | Admitting: Licensed Clinical Social Worker

## 2020-06-21 NOTE — Telephone Encounter (Signed)
I sent a text to patient's number, [phone], to connect for a scheduled video session at 9am. No response.

## 2020-06-29 ENCOUNTER — Telehealth (INDEPENDENT_AMBULATORY_CARE_PROVIDER_SITE_OTHER): Payer: Medicare Other | Admitting: Psychiatry

## 2020-06-29 ENCOUNTER — Encounter (HOSPITAL_COMMUNITY): Payer: Self-pay | Admitting: Psychiatry

## 2020-06-29 DIAGNOSIS — F1910 Other psychoactive substance abuse, uncomplicated: Secondary | ICD-10-CM

## 2020-06-29 DIAGNOSIS — F3131 Bipolar disorder, current episode depressed, mild: Secondary | ICD-10-CM

## 2020-06-29 DIAGNOSIS — F1020 Alcohol dependence, uncomplicated: Secondary | ICD-10-CM

## 2020-06-29 DIAGNOSIS — F0630 Mood disorder due to known physiological condition, unspecified: Secondary | ICD-10-CM
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
## 2020-06-30 ENCOUNTER — Encounter (HOSPITAL_COMMUNITY): Payer: Self-pay | Admitting: Licensed Clinical Social Worker

## 2020-06-30 ENCOUNTER — Ambulatory Visit (HOSPITAL_COMMUNITY): Payer: Medicare Other | Admitting: Licensed Clinical Social Worker

## 2020-06-30 ENCOUNTER — Other Ambulatory Visit: Payer: Self-pay | Admitting: Osteopathic Medicine

## 2020-07-08 ENCOUNTER — Ambulatory Visit (HOSPITAL_COMMUNITY): Payer: Medicare Other | Admitting: Licensed Clinical Social Worker

## 2020-07-08 ENCOUNTER — Other Ambulatory Visit: Payer: Self-pay | Admitting: Osteopathic Medicine

## 2020-07-15 ENCOUNTER — Ambulatory Visit (HOSPITAL_COMMUNITY): Payer: Medicare Other | Admitting: Licensed Clinical Social Worker

## 2020-07-16 ENCOUNTER — Other Ambulatory Visit: Payer: Self-pay | Admitting: Osteopathic Medicine

## 2020-07-16 DIAGNOSIS — G5701 Lesion of sciatic nerve, right lower limb: Secondary | ICD-10-CM

## 2020-07-17 ENCOUNTER — Encounter: Payer: Self-pay | Admitting: Osteopathic Medicine

## 2020-08-11 ENCOUNTER — Other Ambulatory Visit: Payer: Self-pay | Admitting: Osteopathic Medicine

## 2020-08-11 DIAGNOSIS — G5701 Lesion of sciatic nerve, right lower limb: Secondary | ICD-10-CM

## 2020-08-24 ENCOUNTER — Other Ambulatory Visit: Payer: Self-pay | Admitting: Osteopathic Medicine

## 2020-08-24 DIAGNOSIS — G5701 Lesion of sciatic nerve, right lower limb: Secondary | ICD-10-CM

## 2020-09-07 ENCOUNTER — Other Ambulatory Visit: Payer: Self-pay | Admitting: Osteopathic Medicine

## 2020-09-07 DIAGNOSIS — I10 Essential (primary) hypertension: Secondary | ICD-10-CM

## 2020-09-12 ENCOUNTER — Other Ambulatory Visit: Payer: Self-pay | Admitting: Osteopathic Medicine

## 2020-09-12 DIAGNOSIS — I10 Essential (primary) hypertension: Secondary | ICD-10-CM

## 2020-09-30 ENCOUNTER — Telehealth (HOSPITAL_COMMUNITY): Payer: Medicare Other | Admitting: Psychiatry

## 2020-10-03 ENCOUNTER — Other Ambulatory Visit: Payer: Self-pay | Admitting: Osteopathic Medicine

## 2020-10-03 DIAGNOSIS — Z8659 Personal history of other mental and behavioral disorders: Secondary | ICD-10-CM

## 2020-10-03 DIAGNOSIS — G4709 Other insomnia: Secondary | ICD-10-CM

## 2020-10-13 ENCOUNTER — Other Ambulatory Visit: Payer: Self-pay | Admitting: Osteopathic Medicine

## 2020-10-13 DIAGNOSIS — G5701 Lesion of sciatic nerve, right lower limb: Secondary | ICD-10-CM

## 2020-10-19 ENCOUNTER — Telehealth: Payer: Self-pay

## 2020-10-19 DIAGNOSIS — I10 Essential (primary) hypertension: Secondary | ICD-10-CM

## 2020-10-19 MED ORDER — LOSARTAN POTASSIUM 50 MG PO TABS: 100.0000 mg | ORAL_TABLET | Freq: Every day | ORAL | 3 refills | Status: DC

## 2020-10-19 NOTE — Telephone Encounter (Signed)
Per [facility] pharmacy:  Losartan 100 mg is currently on back order. Can provider send in 76 mg/2tabs/daily? Please send in an updated rx.

## 2020-10-31 ENCOUNTER — Other Ambulatory Visit: Payer: Self-pay | Admitting: Osteopathic Medicine

## 2020-11-09 ENCOUNTER — Other Ambulatory Visit: Payer: Self-pay | Admitting: Osteopathic Medicine

## 2020-11-09 DIAGNOSIS — G5701 Lesion of sciatic nerve, right lower limb: Secondary | ICD-10-CM

## 2020-11-10 ENCOUNTER — Other Ambulatory Visit: Payer: Self-pay | Admitting: Osteopathic Medicine

## 2020-11-19 ENCOUNTER — Ambulatory Visit (INDEPENDENT_AMBULATORY_CARE_PROVIDER_SITE_OTHER): Payer: Medicare Other | Admitting: Family Medicine

## 2020-11-19 ENCOUNTER — Encounter: Payer: Self-pay | Admitting: Family Medicine

## 2020-11-19 ENCOUNTER — Other Ambulatory Visit: Payer: Self-pay

## 2020-11-19 VITALS — BP 155/99 | HR 106 | Temp 98.1°F | Resp 20 | Ht 72.0 in | Wt 240.0 lb

## 2020-11-19 DIAGNOSIS — R739 Hyperglycemia, unspecified: Secondary | ICD-10-CM | POA: Diagnosis not present

## 2020-11-19 DIAGNOSIS — E119 Type 2 diabetes mellitus without complications: Secondary | ICD-10-CM

## 2020-11-19 DIAGNOSIS — E785 Hyperlipidemia, unspecified: Secondary | ICD-10-CM

## 2020-11-19 LAB — POCT GLYCOSYLATED HEMOGLOBIN (HGB A1C): Hemoglobin A1C: 9.1 % — AB (ref 4.0–5.6)

## 2020-11-19 LAB — POCT UA - MICROALBUMIN
Albumin/Creatinine Ratio, Urine, POC: <30
Creatinine, POC: 50 mg/dL
Microalbumin Ur, POC: 10 mg/L

## 2020-11-19 LAB — POCT URINALYSIS DIP (CLINITEK)
Bilirubin, UA: NEGATIVE
Glucose, UA: >=1000 mg/dL — AB
Ketones, POC UA: NEGATIVE mg/dL
Leukocytes, UA: NEGATIVE
Nitrite, UA: NEGATIVE
POC PROTEIN,UA: NEGATIVE
Spec Grav, UA: 1.015 (ref 1.010–1.025)
Urobilinogen, UA: 0.2 E.U./dL
pH, UA: 7 (ref 5.0–8.0)

## 2020-11-19 LAB — GLUCOSE, POCT (MANUAL RESULT ENTRY): POC Glucose: >444 mg/dl (ref 70–99)

## 2020-11-19 MED ORDER — BLOOD GLUCOSE MONITOR KIT: PACK | 0 refills | Status: DC

## 2020-11-19 MED ORDER — METFORMIN HCL ER 500 MG PO TB24: 1000.0000 mg | ORAL_TABLET | Freq: Every day | ORAL | 11 refills | Status: DC
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
## 2020-11-19 NOTE — Patient Instructions (Signed)
Diabetes Mellitus Basics  Diabetes mellitus, or diabetes, is a long-term (chronic) disease. It occurs when the body does not properly use sugar (glucose) that is released from food after you eat. Diabetes mellitus may be caused by one or both of these problems:  Your pancreas does not make enough of a hormone called insulin.  Your body does not react in a normal way to the insulin that it makes. Insulin lets glucose enter cells in your body. This gives you energy. If you have diabetes, glucose cannot get into cells. This causes high blood glucose (hyperglycemia). How to treat and manage diabetes You may need to take insulin or other diabetes medicines daily to keep your glucose in balance. If you are prescribed insulin, you will learn how to give yourself insulin by injection. You may need to adjust the amount of insulin you take based on the foods that you eat. You will need to check your blood glucose levels using a glucose monitor as told by your health care provider. The readings can help determine if you have low or high blood glucose. Generally, you should have these blood glucose levels:  Before meals (preprandial): 80-130 mg/dL (4.4-7.2 mmol/L).  After meals (postprandial): below 180 mg/dL (10 mmol/L).  Hemoglobin A1c (HbA1c) level: less than 7%. Your health care provider will set treatment goals for you. Keep all follow-up visits. This is important. Follow these instructions at home: Diabetes medicines Take your diabetes medicines every day as told by your health care provider. List your diabetes medicines here:  Name of medicine: ______________________________ ? Amount (dose): _______________ Time (a.m./p.m.): _______________ Notes: ___________________________________  Name of medicine: ______________________________ ? Amount (dose): _______________ Time (a.m./p.m.): _______________ Notes: ___________________________________  Name of medicine:  ______________________________ ? Amount (dose): _______________ Time (a.m./p.m.): _______________ Notes: ___________________________________ Insulin If you use insulin, list the types of insulin you use here:  Insulin type: ______________________________ ? Amount (dose): _______________ Time (a.m./p.m.): _______________Notes: ___________________________________  Insulin type: ______________________________ ? Amount (dose): _______________ Time (a.m./p.m.): _______________ Notes: ___________________________________  Insulin type: ______________________________ ? Amount (dose): _______________ Time (a.m./p.m.): _______________ Notes: ___________________________________  Insulin type: ______________________________ ? Amount (dose): _______________ Time (a.m./p.m.): _______________ Notes: ___________________________________  Insulin type: ______________________________ ? Amount (dose): _______________ Time (a.m./p.m.): _______________ Notes: ___________________________________ Managing blood glucose Check your blood glucose levels using a glucose monitor as told by your health care provider. Write down the times that you check your glucose levels here:  Time: _______________ Notes: ___________________________________  Time: _______________ Notes: ___________________________________  Time: _______________ Notes: ___________________________________  Time: _______________ Notes: ___________________________________  Time: _______________ Notes: ___________________________________  Time: _______________ Notes: ___________________________________   Low blood glucose Low blood glucose (hypoglycemia) is when glucose is at or below 70 mg/dL (3.9 mmol/L). Symptoms may include:  Feeling: ? Hungry. ? Sweaty and clammy. ? Irritable or easily upset. ? Dizzy. ? Sleepy.  Having: ? A fast heartbeat. ? A headache. ? A change in your vision. ? Numbness around the mouth, lips, or  tongue.  Having trouble with: ? Moving (coordination). ? Sleeping. Treating low blood glucose To treat low blood glucose, eat or drink something containing sugar right away. If you can think clearly and swallow safely, follow the 15:15 rule:  Take 15 grams of a fast-acting carb (carbohydrate), as told by your health care provider.  Some fast-acting carbs are: ? Glucose tablets: take 3-4 tablets. ? Hard candy: eat 3-5 pieces. ? Fruit juice: drink 4 oz (120 mL). ? Regular (not diet) soda: drink 4-6 oz (120-180 mL). ? Honey or sugar:   eat 1 Tbsp (15 mL).  Check your blood glucose levels 15 minutes after you take the carb.  If your glucose is still at or below 70 mg/dL (3.9 mmol/L), take 15 grams of a carb again.  If your glucose does not go above 70 mg/dL (3.9 mmol/L) after 3 tries, get help right away.  After your glucose goes back to normal, eat a meal or a snack within 1 hour. Treating very low blood glucose If your glucose is at or below 54 mg/dL (3 mmol/L), you have very low blood glucose (severe hypoglycemia). This is an emergency. Do not wait to see if the symptoms will go away. Get medical help right away. Call your local emergency services (911 in the [place] Do not drive yourself to the hospital. Questions to ask your health care provider  Should I talk with a diabetes educator?  What equipment will I need to care for myself at home?  What diabetes medicines do I need? When should I take them?  How often do I need to check my blood glucose levels?  What number can I call if I have questions?  When is my follow-up visit?  Where can I find a support group for people with diabetes? Where to find more information  American Diabetes Association: www.diabetes.org  Association of Diabetes Care and Education Specialists: www.diabeteseducator.org Contact a health care provider if:  Your blood glucose is at or above 240 mg/dL (13.3 mmol/L) for 2 days in a row.  You have  been sick or have had a fever for 2 days or more, and you are not getting better.  You have any of these problems for more than 6 hours: ? You cannot eat or drink. ? You feel nauseous. ? You vomit. ? You have diarrhea. Get help right away if:  Your blood glucose is lower than 54 mg/dL (3 mmol/L).  You get confused.  You have trouble thinking clearly.  You have trouble breathing. These symptoms may represent a serious problem that is an emergency. Do not wait to see if the symptoms will go away. Get medical help right away. Call your local emergency services (911 in the [place] Do not drive yourself to the hospital. Summary  Diabetes mellitus is a chronic disease that occurs when the body does not properly use sugar (glucose) that is released from food after you eat.  Take insulin and diabetes medicines as told.  Check your blood glucose every day, as often as told.  Keep all follow-up visits. This is important. This information is not intended to replace advice given to you by your health care provider. Make sure you discuss any questions you have with your health care provider. Document Revised: 01/13/2020 Document Reviewed: 01/13/2020 Elsevier Patient Education  2021 Elsevier Inc.  

## 2020-11-22 LAB — COMPLETE METABOLIC PANEL WITH GFR
AG Ratio: 2 (calc) (ref 1.0–2.5)
ALT: 39 U/L (ref 9–46)
AST: 26 U/L (ref 10–40)
Albumin: 4.7 g/dL (ref 3.6–5.1)
Alkaline phosphatase (APISO): 137 U/L — ABNORMAL HIGH (ref 36–130)
BUN: 14 mg/dL (ref 7–25)
CO2: 34 mmol/L — ABNORMAL HIGH (ref 20–32)
Calcium: 9.6 mg/dL (ref 8.6–10.3)
Chloride: 94 mmol/L — ABNORMAL LOW (ref 98–110)
Creat: 0.77 mg/dL (ref 0.60–1.35)
GFR, Est African American: 129 mL/min/{1.73_m2} (ref >=60)
GFR, Est Non African American: 111 mL/min/{1.73_m2} (ref >=60)
Globulin: 2.4 g/dL (calc) (ref 1.9–3.7)
Glucose, Bld: 359 mg/dL — ABNORMAL HIGH (ref 65–99)
Potassium: 4.4 mmol/L (ref 3.5–5.3)
Sodium: 138 mmol/L (ref 135–146)
Total Bilirubin: 0.9 mg/dL (ref 0.2–1.2)
Total Protein: 7.1 g/dL (ref 6.1–8.1)

## 2020-11-22 LAB — LIPID PANEL
Cholesterol: 206 mg/dL — ABNORMAL HIGH (ref <200)
HDL: 30 mg/dL — ABNORMAL LOW (ref >=40)
Non-HDL Cholesterol (Calc): 176 mg/dL (calc) — ABNORMAL HIGH (ref <130)
Total CHOL/HDL Ratio: 6.9 (calc) — ABNORMAL HIGH (ref <5.0)
Triglycerides: 554 mg/dL — ABNORMAL HIGH (ref <150)

## 2020-11-23 ENCOUNTER — Other Ambulatory Visit: Payer: Self-pay | Admitting: Family Medicine

## 2020-11-23 DIAGNOSIS — E119 Type 2 diabetes mellitus without complications: Secondary | ICD-10-CM

## 2020-11-23 MED ORDER — BLOOD GLUCOSE MONITOR KIT: PACK | 0 refills | Status: AC

## 2020-11-23 MED ORDER — GLUCOSE BLOOD VI STRP: ORAL_STRIP | 12 refills | Status: DC

## 2020-11-23 MED ORDER — ACCU-CHEK SOFTCLIX LANCETS MISC: 12 refills | Status: DC

## 2020-11-23 NOTE — Progress Notes (Signed)
We expected your glucose to be high, keep taking the metformin and keep a record of your home readings. Follow-up with Dr. Lyn Hollingshead as scheduled. I would like to recheck your cholesterol/triglycerides when you are fasting to get more accurate numbers before you see her at the next appointment. I will place the orders for you to come anytime in the next week or two when you can be fasting.

## 2020-11-23 NOTE — Addendum Note (Signed)
Addended by: Hyman Hopes B on: 11/23/2020 08:11 AM   Modules accepted: Orders

## 2020-11-24 ENCOUNTER — Other Ambulatory Visit: Payer: Self-pay | Admitting: Osteopathic Medicine

## 2020-11-24 DIAGNOSIS — G5701 Lesion of sciatic nerve, right lower limb: Secondary | ICD-10-CM

## 2020-11-25 ENCOUNTER — Other Ambulatory Visit: Payer: Self-pay | Admitting: Osteopathic Medicine

## 2020-11-25 DIAGNOSIS — G8929 Other chronic pain: Secondary | ICD-10-CM

## 2020-11-26 ENCOUNTER — Telehealth: Payer: Self-pay

## 2020-11-26 NOTE — Telephone Encounter (Signed)
Pt called requesting to have the referral for Nutrition and Diabetes Management at a location in [facility]. Per pt, he wants something closer to home. Thanks in advance.

## 2020-11-27 ENCOUNTER — Other Ambulatory Visit: Payer: Self-pay | Admitting: Osteopathic Medicine

## 2020-11-30 NOTE — Telephone Encounter (Signed)
Referral sent to [facility]

## 2020-12-01 ENCOUNTER — Other Ambulatory Visit: Payer: Self-pay | Admitting: Osteopathic Medicine

## 2020-12-16 ENCOUNTER — Encounter: Payer: Self-pay | Admitting: Osteopathic Medicine

## 2020-12-16 ENCOUNTER — Other Ambulatory Visit: Payer: Self-pay

## 2020-12-16 ENCOUNTER — Ambulatory Visit (INDEPENDENT_AMBULATORY_CARE_PROVIDER_SITE_OTHER): Payer: Medicare Other | Admitting: Osteopathic Medicine

## 2020-12-16 VITALS — BP 131/79 | HR 112 | Temp 97.7°F | Wt 252.0 lb

## 2020-12-16 DIAGNOSIS — E119 Type 2 diabetes mellitus without complications: Secondary | ICD-10-CM | POA: Diagnosis not present

## 2020-12-16 DIAGNOSIS — I10 Essential (primary) hypertension: Secondary | ICD-10-CM

## 2020-12-16 DIAGNOSIS — M48062 Spinal stenosis, lumbar region with neurogenic claudication: Secondary | ICD-10-CM | POA: Diagnosis not present

## 2020-12-16 DIAGNOSIS — G5701 Lesion of sciatic nerve, right lower limb: Secondary | ICD-10-CM | POA: Diagnosis not present

## 2020-12-16 DIAGNOSIS — F39 Unspecified mood [affective] disorder: Secondary | ICD-10-CM

## 2020-12-16 DIAGNOSIS — F339 Major depressive disorder, recurrent, unspecified: Secondary | ICD-10-CM

## 2020-12-16 MED ORDER — OZEMPIC (0.25 OR 0.5 MG/DOSE) 2 MG/1.5ML ~~LOC~~ SOPN: 0.5000 mg | PEN_INJECTOR | SUBCUTANEOUS | 3 refills | Status: DC

## 2020-12-16 NOTE — Progress Notes (Signed)
Bobby Shaw is a 45 y.o. male who presents to  [place] at [facility]  today, 12/16/20, seeking care for the following:  . Prediabetes (last A1C 2 yr ago 6.1) --> diabetes (most recent A1C 02/252/22 9.1). Fasting Glc still typically in 300's.  . Noted that he had boil on testicle that has since resolved, would like to avoid exam if possible.       ASSESSMENT & PLAN with other pertinent findings:  The primary encounter diagnosis was Type 2 diabetes mellitus without complication, without long-term current use of insulin ([place]). Diagnoses of Hypertension goal BP (blood pressure) < 130/80, Spinal stenosis of lumbar region with neurogenic claudication, Sciatic neuropathy, right, Mood disorder ([place]), and Recurrent major depressive disorder, remission status unspecified ([place]) were also pertinent to this visit.   Starting SGLT2-I Dietician referral placed  Limited ability to exercise given his MSK issues Mental health - seeing psychiatry, stable Skin - defers exam today, sounds like he had abscess which resolved, advised RTC for exam if recurrence.       There are no Patient Instructions on file for this visit.  Orders Placed This Encounter  Procedures  . Amb ref to Medical Nutrition Therapy-MNT    Meds ordered this encounter  Medications  . Semaglutide,0.25 or 0.5MG/DOS, (OZEMPIC, 0.25 OR 0.5 MG/DOSE,) 2 MG/1.5ML SOPN    Sig: Inject 0.5 mg into the skin once a week. (can decrease to 0.25 mg weekly for 4 doses if the 0.5 mg dose causes nausea)    Dispense:  7.5 mL    Refill:  3     See below for relevant physical exam findings  See below for recent lab and imaging results reviewed  Medications, allergies, PMH, PSH, SocH, FamH reviewed below    Follow-up instructions: Return in about 2 months (around 02/15/2021) for IN-OFFICE VISIT, MONITOR A1C .                                        Exam:   BP 131/79 (BP Location: Left Arm, Patient Position: Sitting, Cuff Size: Large)   Pulse (!) 112   Temp 97.7 F (36.5 C) (Oral)   Wt 252 lb (114.3 kg)   BMI 34.18 kg/m   Constitutional: VS see above. General Appearance: alert, well-developed, well-nourished, NAD  Neck: No masses, trachea midline.   Respiratory: Normal respiratory effort. no wheeze, no rhonchi, no rales  Cardiovascular: S1/S2 normal, no murmur, no rub/gallop auscultated. RRR.   Skin: warm, dry, intact.   Psychiatric: Normal judgment/insight. Normal mood and affect. Oriented x3.   Current Meds  Medication Sig  . Accu-Chek Softclix Lancets lancets Use as instructed  . allopurinol (ZYLOPRIM) 100 MG tablet Take 2 tablets by mouth once daily  . amitriptyline (ELAVIL) 50 MG tablet Take 1 tablet (50 mg total) by mouth at bedtime.  Marland Kitchen atorvastatin (LIPITOR) 20 MG tablet TAKE 1 TABLET BY MOUTH ONCE DAILY .  NEEDS  APPT  WITH  DOCTOR  . b complex vitamins capsule Take 1 capsule by mouth daily.  . B Complex-Biotin-FA (SUPER B-50 B COMPLEX) CAPS Take by mouth.  . blood glucose meter kit and supplies KIT Dispense based on patient and insurance preference. 1-2 times daily.  . Buprenorphine HCl-Naloxone HCl (SUBOXONE SL) Place 8 mg under the tongue in the morning, at noon, and at bedtime.  . cholecalciferol (VITAMIN D)  1000 units tablet Take 1,000 Units by mouth daily.  . Colchicine (MITIGARE) 0.6 MG CAPS Take 0.6 mg by mouth daily.  . DULoxetine (CYMBALTA) 60 MG capsule TAKE 2 CAPSULES BY MOUTH ONCE DAILY . APPOINTMENT REQUIRED FOR FUTURE REFILLS  . gabapentin (NEURONTIN) 400 MG capsule TAKE 2 CAPSULES BY MOUTH THREE TIMES DAILY  . glucose blood test strip Use as instructed  . ibuprofen (ADVIL) 800 MG tablet Take 1 tablet (800 mg total) by mouth every 8 (eight) hours as needed.  Marland Kitchen losartan (COZAAR) 50 MG tablet Take 2 tablets (100 mg total) by mouth daily.  . metFORMIN (GLUCOPHAGE XR) 500 MG 24 hr tablet Take 2 tablets (1,000 mg  total) by mouth daily with breakfast.  . pantoprazole (PROTONIX) 40 MG tablet Take 1 tablet by mouth once daily  . QUEtiapine (SEROQUEL) 50 MG tablet Take 1 tablet (50 mg total) by mouth at bedtime. appt for refills  . Semaglutide,0.25 or 0.5MG/DOS, (OZEMPIC, 0.25 OR 0.5 MG/DOSE,) 2 MG/1.5ML SOPN Inject 0.5 mg into the skin once a week. (can decrease to 0.25 mg weekly for 4 doses if the 0.5 mg dose causes nausea)    No Known Allergies  Patient Active Problem List   Diagnosis Date Noted  . Type 2 diabetes mellitus without complication, without long-term current use of insulin ([place]) 11/23/2020  . Major depressive disorder 11/20/2019  . Overdose 11/20/2019  . Closed fracture of proximal phalanx of right third toe 06/24/2019  . Nocturnal hypoxemia 11/29/2018  . Umbilical hernia without obstruction and without gangrene 01/22/2018  . Insomnia disorder, with other sleep disorder, recurrent 11/25/2017  . Hypertriglyceridemia 11/25/2017  . Iron deficiency anemia 11/25/2017  . Chronic neuropathic pain 11/25/2017  . Coronary arteriosclerosis due to lipid rich plaque 08/21/2017  . Lumbar spinal stenosis 08/13/2017  . Pulmonary nodule 05/22/2017  . Biceps tendinitis of left shoulder 03/20/2017  . Seizure due to alcohol withdrawal ([place]) 03/16/2017  . Gait difficulty 03/08/2017  . Tobacco abuse 03/08/2017  . Chronic diastolic congestive heart failure ([place]) 02/23/2017  . Alcohol use disorder, severe, dependence ([place]) 02/22/2017  . Traumatic compartment syndrome of right lower extremity ([place]) 01/02/2017  . Bipolar disorder ([place]) 10/15/2016  . Right leg swelling 08/22/2016  . Sciatic neuropathy, right 08/16/2016  . Polysubstance abuse ([place]) 07/07/2016  . ANXIETY 02/13/2010  . Hypertension goal BP (blood pressure) < 130/80 02/13/2010    Family History  Problem Relation Age of Onset  . Hypertension Father     Social History   Tobacco Use  Smoking Status Current Every Day Smoker  .  Packs/day: 1.00  . Years: 20.00  . Pack years: 20.00  . Types: Cigarettes  Smokeless Tobacco Never Used    Past Surgical History:  Procedure Laterality Date  . FOOT SURGERY      Immunization History  Administered Date(s) Administered  . Tdap 01/22/2018    Recent Results (from the past 2160 hour(s))  POCT UA - Microalbumin     Status: None   Collection Time: 11/19/20 11:02 AM  Result Value Ref Range   Microalbumin Ur, POC 10 mg/L   Creatinine, POC 50 mg/dL   Albumin/Creatinine Ratio, Urine, POC <30   POCT glycosylated hemoglobin (Hb A1C)     Status: Abnormal   Collection Time: 11/19/20 11:03 AM  Result Value Ref Range   Hemoglobin A1C 9.1 (A) 4.0 - 5.6 %   HbA1c POC (<> result, manual entry)     HbA1c, POC (prediabetic range)     HbA1c,  POC (controlled diabetic range)    POCT Glucose (CBG)     Status: None   Collection Time: 11/19/20 11:04 AM  Result Value Ref Range   POC Glucose >444 70 - 99 mg/dl    Comment: Caleen Jobs NP notified of result  POCT URINALYSIS DIP (CLINITEK)     Status: Abnormal   Collection Time: 11/19/20 11:05 AM  Result Value Ref Range   Color, UA yellow yellow   Clarity, UA clear clear   Glucose, UA >=1,000 (A) negative mg/dL   Bilirubin, UA negative negative   Ketones, POC UA negative negative mg/dL   Spec Grav, UA 1.015 1.010 - 1.025   Blood, UA trace-intact (A) negative   pH, UA 7.0 5.0 - 8.0   POC PROTEIN,UA negative negative, trace   Urobilinogen, UA 0.2 0.2 or 1.0 E.U./dL   Nitrite, UA Negative Negative   Leukocytes, UA Negative Negative  COMPLETE METABOLIC PANEL WITH GFR     Status: Abnormal   Collection Time: 11/22/20 12:00 AM  Result Value Ref Range   Glucose, Bld 359 (H) 65 - 99 mg/dL    Comment: .            Fasting reference interval . For someone without known diabetes, a glucose value >125 mg/dL indicates that they may have diabetes and this should be confirmed with a follow-up test. .    BUN 14 7 - 25 mg/dL   Creat  0.77 0.60 - 1.35 mg/dL   GFR, Est Non African American 111 > OR = 60 mL/min/1.13m   GFR, Est African American 129 > OR = 60 mL/min/1.756m  BUN/Creatinine Ratio NOT APPLICABLE 6 - 22 (calc)   Sodium 138 135 - 146 mmol/L   Potassium 4.4 3.5 - 5.3 mmol/L   Chloride 94 (L) 98 - 110 mmol/L   CO2 34 (H) 20 - 32 mmol/L   Calcium 9.6 8.6 - 10.3 mg/dL   Total Protein 7.1 6.1 - 8.1 g/dL   Albumin 4.7 3.6 - 5.1 g/dL   Globulin 2.4 1.9 - 3.7 g/dL (calc)   AG Ratio 2.0 1.0 - 2.5 (calc)   Total Bilirubin 0.9 0.2 - 1.2 mg/dL   Alkaline phosphatase (APISO) 137 (H) 36 - 130 U/L   AST 26 10 - 40 U/L   ALT 39 9 - 46 U/L  Lipid panel     Status: Abnormal   Collection Time: 11/22/20 12:00 AM  Result Value Ref Range   Cholesterol 206 (H) <200 mg/dL   HDL 30 (L) > OR = 40 mg/dL   Triglycerides 554 (H) <150 mg/dL    Comment: . If a non-fasting specimen was collected, consider repeat triglyceride testing on a fasting specimen if clinically indicated.  JaYates Decampt al. J. of Clin. Lipidol. 203532;9:924-268. . There is increased risk of pancreatitis when the  triglyceride concentration is very high  (> or = 500 mg/dL, especially if > or = 1000 mg/dL).  JaYates Decampt al. J. of Clin. Lipidol. 203419;6:222-979. Marland Kitchen  LDL Cholesterol (Calc)  mg/dL (calc)    Comment: . LDL cholesterol not calculated. Triglyceride levels greater than 400 mg/dL invalidate calculated LDL results. . Reference range: <100 . Desirable range <100 mg/dL for primary prevention;   <70 mg/dL for patients with CHD or diabetic patients  with > or = 2 CHD risk factors. . Marland KitchenDL-C is now calculated using the Martin-Hopkins  calculation, which is a validated novel method providing  better accuracy than the [employer]  equation in the  estimation of LDL-C.  Cresenciano Genre et al. Annamaria Helling. 7591;638(46): 2061-2068  (http://education.Quest[employer].com/faq/FAQ164)    Total CHOL/HDL Ratio 6.9 (H) <5.0 (calc)   Non-HDL Cholesterol (Calc) 176 (H)  <130 mg/dL (calc)    Comment: For patients with diabetes plus 1 major ASCVD risk  factor, treating to a non-HDL-C goal of <100 mg/dL  (LDL-C of <70 mg/dL) is considered a therapeutic  option.     No results found.     All questions at time of visit were answered - patient instructed to contact office with any additional concerns or updates. ER/RTC precautions were reviewed with the patient as applicable.   Please note: manual typing as well as voice recognition software may have been used to produce this document - typos may escape review. Please contact Dr. Sheppard Coil for any needed clarifications.

## 2021-01-02 ENCOUNTER — Encounter: Payer: Self-pay | Admitting: Osteopathic Medicine

## 2021-01-02 DIAGNOSIS — G5701 Lesion of sciatic nerve, right lower limb: Secondary | ICD-10-CM

## 2021-01-02 DIAGNOSIS — R269 Unspecified abnormalities of gait and mobility: Secondary | ICD-10-CM

## 2021-01-02 DIAGNOSIS — T79A21S Traumatic compartment syndrome of right lower extremity, sequela: Secondary | ICD-10-CM

## 2021-01-08 ENCOUNTER — Other Ambulatory Visit: Payer: Self-pay | Admitting: Osteopathic Medicine

## 2021-01-08 DIAGNOSIS — Z8659 Personal history of other mental and behavioral disorders: Secondary | ICD-10-CM

## 2021-01-08 DIAGNOSIS — G4709 Other insomnia: Secondary | ICD-10-CM

## 2021-01-15 ENCOUNTER — Other Ambulatory Visit: Payer: Self-pay | Admitting: Osteopathic Medicine

## 2021-01-30 ENCOUNTER — Other Ambulatory Visit: Payer: Self-pay | Admitting: Osteopathic Medicine

## 2021-02-15 ENCOUNTER — Ambulatory Visit: Payer: Medicare Other | Admitting: Osteopathic Medicine

## 2021-02-28 ENCOUNTER — Ambulatory Visit: Payer: Medicare Other | Admitting: Osteopathic Medicine

## 2021-03-01 ENCOUNTER — Other Ambulatory Visit: Payer: Self-pay | Admitting: Osteopathic Medicine

## 2021-03-16 ENCOUNTER — Telehealth (INDEPENDENT_AMBULATORY_CARE_PROVIDER_SITE_OTHER): Payer: Medicare Other | Admitting: Osteopathic Medicine

## 2021-03-16 ENCOUNTER — Encounter: Payer: Self-pay | Admitting: Osteopathic Medicine

## 2021-03-16 VITALS — Wt 240.0 lb

## 2021-03-16 DIAGNOSIS — I10 Essential (primary) hypertension: Secondary | ICD-10-CM | POA: Diagnosis not present

## 2021-03-16 DIAGNOSIS — E119 Type 2 diabetes mellitus without complications: Secondary | ICD-10-CM

## 2021-03-16 DIAGNOSIS — G4709 Other insomnia: Secondary | ICD-10-CM

## 2021-03-16 DIAGNOSIS — Z8659 Personal history of other mental and behavioral disorders: Secondary | ICD-10-CM
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
## 2021-03-17 MED ORDER — ALLOPURINOL 100 MG PO TABS: ORAL_TABLET | ORAL | 3 refills | Status: DC

## 2021-03-17 MED ORDER — QUETIAPINE FUMARATE 50 MG PO TABS: ORAL_TABLET | ORAL | 3 refills | Status: DC

## 2021-03-19 ENCOUNTER — Other Ambulatory Visit: Payer: Self-pay | Admitting: Osteopathic Medicine

## 2021-03-19 DIAGNOSIS — G5701 Lesion of sciatic nerve, right lower limb: Secondary | ICD-10-CM

## 2021-04-05 ENCOUNTER — Encounter: Payer: Self-pay | Admitting: Osteopathic Medicine

## 2021-05-31 ENCOUNTER — Other Ambulatory Visit: Payer: Self-pay | Admitting: Osteopathic Medicine

## 2021-06-23 ENCOUNTER — Ambulatory Visit (INDEPENDENT_AMBULATORY_CARE_PROVIDER_SITE_OTHER): Payer: Medicare Other | Admitting: Family Medicine

## 2021-06-23 ENCOUNTER — Other Ambulatory Visit: Payer: Self-pay

## 2021-06-23 ENCOUNTER — Encounter: Payer: Self-pay | Admitting: Family Medicine

## 2021-06-23 VITALS — BP 130/75 | HR 106 | Temp 97.9°F | Ht 72.0 in | Wt 228.0 lb

## 2021-06-23 DIAGNOSIS — T79A21S Traumatic compartment syndrome of right lower extremity, sequela: Secondary | ICD-10-CM

## 2021-06-23 DIAGNOSIS — I10 Essential (primary) hypertension: Secondary | ICD-10-CM | POA: Diagnosis not present

## 2021-06-23 DIAGNOSIS — E119 Type 2 diabetes mellitus without complications: Secondary | ICD-10-CM

## 2021-06-23 DIAGNOSIS — E781 Pure hyperglyceridemia: Secondary | ICD-10-CM

## 2021-06-23 DIAGNOSIS — M792 Neuralgia and neuritis, unspecified: Secondary | ICD-10-CM

## 2021-06-23 DIAGNOSIS — G8929 Other chronic pain: Secondary | ICD-10-CM

## 2021-06-23 MED ORDER — GABAPENTIN 400 MG PO CAPS: 800.0000 mg | ORAL_CAPSULE | Freq: Three times a day (TID) | ORAL | 1 refills | Status: DC

## 2021-06-23 NOTE — Assessment & Plan Note (Signed)
Diabetes has been poorly controlled due to poor compliance with medication and diet.  Updating A1c today.  Encouraged to use medication as directed.  Low carbohydrate diet encouraged.

## 2021-06-23 NOTE — Assessment & Plan Note (Signed)
Chronic neuropathic pain along with pain related to spinal stenosis.  He does have history of opioid dependence and is currently on Suboxone.  He will continue this for now.  Gabapentin renewed.  He will continue this in combination with duloxetine.
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
## 2021-06-23 NOTE — Assessment & Plan Note (Signed)
Blood pressure is well controlled at this time.  Low-sodium diet encouraged.

## 2021-06-30 DIAGNOSIS — J9600 Acute respiratory failure, unspecified whether with hypoxia or hypercapnia: Secondary | ICD-10-CM | POA: Diagnosis not present

## 2021-07-14 DIAGNOSIS — G894 Chronic pain syndrome: Secondary | ICD-10-CM | POA: Diagnosis not present

## 2021-07-19 DIAGNOSIS — M5126 Other intervertebral disc displacement, lumbar region: Secondary | ICD-10-CM | POA: Diagnosis not present

## 2021-07-19 DIAGNOSIS — M5412 Radiculopathy, cervical region: Secondary | ICD-10-CM | POA: Diagnosis not present

## 2021-07-19 DIAGNOSIS — K5903 Drug induced constipation: Secondary | ICD-10-CM | POA: Diagnosis not present

## 2021-07-19 DIAGNOSIS — M5137 Other intervertebral disc degeneration, lumbosacral region: Secondary | ICD-10-CM | POA: Diagnosis not present

## 2021-07-19 DIAGNOSIS — M25571 Pain in right ankle and joints of right foot: Secondary | ICD-10-CM | POA: Diagnosis not present

## 2021-07-19 DIAGNOSIS — G894 Chronic pain syndrome: Secondary | ICD-10-CM | POA: Diagnosis not present

## 2021-07-19 DIAGNOSIS — M25511 Pain in right shoulder: Secondary | ICD-10-CM | POA: Diagnosis not present

## 2021-07-19 DIAGNOSIS — M25572 Pain in left ankle and joints of left foot: Secondary | ICD-10-CM | POA: Diagnosis not present

## 2021-07-19 DIAGNOSIS — M25512 Pain in left shoulder: Secondary | ICD-10-CM | POA: Diagnosis not present

## 2021-07-19 DIAGNOSIS — M542 Cervicalgia: Secondary | ICD-10-CM | POA: Diagnosis not present

## 2021-07-31 DIAGNOSIS — J9600 Acute respiratory failure, unspecified whether with hypoxia or hypercapnia: Secondary | ICD-10-CM | POA: Diagnosis not present

## 2021-09-05 ENCOUNTER — Other Ambulatory Visit: Payer: Self-pay | Admitting: Osteopathic Medicine

## 2021-09-13 LAB — HM DIABETES EYE EXAM

## 2021-09-30 ENCOUNTER — Encounter: Payer: Self-pay | Admitting: Family Medicine

## 2021-09-30 DIAGNOSIS — J9600 Acute respiratory failure, unspecified whether with hypoxia or hypercapnia: Secondary | ICD-10-CM | POA: Diagnosis not present

## 2021-10-04 DIAGNOSIS — M5412 Radiculopathy, cervical region: Secondary | ICD-10-CM | POA: Diagnosis not present

## 2021-10-04 DIAGNOSIS — M25511 Pain in right shoulder: Secondary | ICD-10-CM | POA: Diagnosis not present

## 2021-10-04 DIAGNOSIS — Z79891 Long term (current) use of opiate analgesic: Secondary | ICD-10-CM | POA: Diagnosis not present

## 2021-10-04 DIAGNOSIS — M25571 Pain in right ankle and joints of right foot: Secondary | ICD-10-CM | POA: Diagnosis not present

## 2021-10-04 DIAGNOSIS — K5903 Drug induced constipation: Secondary | ICD-10-CM | POA: Diagnosis not present

## 2021-10-04 DIAGNOSIS — G894 Chronic pain syndrome: Secondary | ICD-10-CM | POA: Diagnosis not present

## 2021-10-04 DIAGNOSIS — M545 Low back pain, unspecified: Secondary | ICD-10-CM | POA: Diagnosis not present

## 2021-10-04 DIAGNOSIS — M25572 Pain in left ankle and joints of left foot: Secondary | ICD-10-CM | POA: Diagnosis not present

## 2021-10-04 DIAGNOSIS — M542 Cervicalgia: Secondary | ICD-10-CM | POA: Diagnosis not present

## 2021-10-04 DIAGNOSIS — G5790 Unspecified mononeuropathy of unspecified lower limb: Secondary | ICD-10-CM | POA: Diagnosis not present

## 2021-10-04 DIAGNOSIS — M25512 Pain in left shoulder: Secondary | ICD-10-CM | POA: Diagnosis not present

## 2021-10-04 DIAGNOSIS — M5414 Radiculopathy, thoracic region: Secondary | ICD-10-CM | POA: Diagnosis not present

## 2021-10-05 ENCOUNTER — Other Ambulatory Visit: Payer: Self-pay | Admitting: Osteopathic Medicine

## 2021-10-05 DIAGNOSIS — I10 Essential (primary) hypertension: Secondary | ICD-10-CM

## 2021-10-31 DIAGNOSIS — J9600 Acute respiratory failure, unspecified whether with hypoxia or hypercapnia: Secondary | ICD-10-CM | POA: Diagnosis not present

## 2021-11-01 DIAGNOSIS — M25571 Pain in right ankle and joints of right foot: Secondary | ICD-10-CM | POA: Diagnosis not present

## 2021-11-01 DIAGNOSIS — M25511 Pain in right shoulder: Secondary | ICD-10-CM | POA: Diagnosis not present

## 2021-11-01 DIAGNOSIS — M25572 Pain in left ankle and joints of left foot: Secondary | ICD-10-CM | POA: Diagnosis not present

## 2021-11-01 DIAGNOSIS — M25512 Pain in left shoulder: Secondary | ICD-10-CM | POA: Diagnosis not present

## 2021-11-01 DIAGNOSIS — K5903 Drug induced constipation: Secondary | ICD-10-CM | POA: Diagnosis not present

## 2021-11-01 DIAGNOSIS — M5412 Radiculopathy, cervical region: Secondary | ICD-10-CM | POA: Diagnosis not present

## 2021-11-01 DIAGNOSIS — G894 Chronic pain syndrome: Secondary | ICD-10-CM | POA: Diagnosis not present

## 2021-11-01 DIAGNOSIS — M545 Low back pain, unspecified: Secondary | ICD-10-CM | POA: Diagnosis not present

## 2021-11-01 DIAGNOSIS — Z79891 Long term (current) use of opiate analgesic: Secondary | ICD-10-CM | POA: Diagnosis not present

## 2021-11-01 DIAGNOSIS — M542 Cervicalgia: Secondary | ICD-10-CM | POA: Diagnosis not present

## 2021-11-23 ENCOUNTER — Encounter: Payer: Self-pay | Admitting: Family Medicine

## 2021-11-23 ENCOUNTER — Telehealth (INDEPENDENT_AMBULATORY_CARE_PROVIDER_SITE_OTHER): Payer: Medicare Other | Admitting: Family Medicine

## 2021-11-23 VITALS — Ht 69.0 in | Wt 215.0 lb

## 2021-11-23 DIAGNOSIS — K429 Umbilical hernia without obstruction or gangrene: Secondary | ICD-10-CM

## 2021-11-23 DIAGNOSIS — J069 Acute upper respiratory infection, unspecified: Secondary | ICD-10-CM

## 2021-11-23 DIAGNOSIS — M48062 Spinal stenosis, lumbar region with neurogenic claudication: Secondary | ICD-10-CM | POA: Diagnosis not present

## 2021-11-23 MED ORDER — PREDNISONE 50 MG PO TABS: ORAL_TABLET | ORAL | 0 refills | Status: DC

## 2021-11-23 NOTE — Assessment & Plan Note (Signed)
Continue supportive and symptomatic care.  Adding prednisone for associated sinusitis.  He will let me know if symptoms change or worsen. ?
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
## 2021-11-23 NOTE — Assessment & Plan Note (Signed)
Some increased pain.  Adding course of prednisone.  Instructed to follow-up if symptoms persist. ?

## 2021-11-23 NOTE — Assessment & Plan Note (Signed)
Referral placed to general surgery.

## 2021-11-28 DIAGNOSIS — J9600 Acute respiratory failure, unspecified whether with hypoxia or hypercapnia: Secondary | ICD-10-CM | POA: Diagnosis not present

## 2021-11-29 DIAGNOSIS — M5136 Other intervertebral disc degeneration, lumbar region: Secondary | ICD-10-CM | POA: Diagnosis not present

## 2021-11-29 DIAGNOSIS — Z79891 Long term (current) use of opiate analgesic: Secondary | ICD-10-CM | POA: Diagnosis not present

## 2021-11-29 DIAGNOSIS — G8929 Other chronic pain: Secondary | ICD-10-CM | POA: Diagnosis not present

## 2021-11-29 DIAGNOSIS — M25572 Pain in left ankle and joints of left foot: Secondary | ICD-10-CM | POA: Diagnosis not present

## 2021-11-29 DIAGNOSIS — M5412 Radiculopathy, cervical region: Secondary | ICD-10-CM | POA: Diagnosis not present

## 2021-11-29 DIAGNOSIS — M545 Low back pain, unspecified: Secondary | ICD-10-CM | POA: Diagnosis not present

## 2021-11-29 DIAGNOSIS — M25512 Pain in left shoulder: Secondary | ICD-10-CM | POA: Diagnosis not present

## 2021-11-29 DIAGNOSIS — M25511 Pain in right shoulder: Secondary | ICD-10-CM | POA: Diagnosis not present

## 2021-11-29 DIAGNOSIS — G894 Chronic pain syndrome: Secondary | ICD-10-CM | POA: Diagnosis not present

## 2021-11-29 DIAGNOSIS — M25571 Pain in right ankle and joints of right foot: Secondary | ICD-10-CM | POA: Diagnosis not present

## 2021-11-29 DIAGNOSIS — K5903 Drug induced constipation: Secondary | ICD-10-CM | POA: Diagnosis not present

## 2021-11-29 DIAGNOSIS — M542 Cervicalgia: Secondary | ICD-10-CM | POA: Diagnosis not present

## 2021-12-01 ENCOUNTER — Telehealth: Payer: Medicare Other | Admitting: Family Medicine

## 2021-12-01 DIAGNOSIS — M79641 Pain in right hand: Secondary | ICD-10-CM

## 2021-12-01 NOTE — Progress Notes (Signed)
Arma  ? ?Needs to be seen in person for possible broken metacarpal  ?Calling PCP now. ? ?Patient acknowledged agreement and understanding of the plan.  ? ?

## 2021-12-05 ENCOUNTER — Other Ambulatory Visit: Payer: Self-pay | Admitting: Osteopathic Medicine

## 2021-12-05 DIAGNOSIS — G5701 Lesion of sciatic nerve, right lower limb: Secondary | ICD-10-CM

## 2021-12-06 NOTE — Telephone Encounter (Signed)
Please contact pt to schedule follow-up appt for DM. Due in April.  ? ?Sending 30 day med refill. ?

## 2021-12-07 NOTE — Telephone Encounter (Signed)
LVM for patient to call back to get this f/u appt scheduled for April. AM ?

## 2021-12-15 DIAGNOSIS — K429 Umbilical hernia without obstruction or gangrene: Secondary | ICD-10-CM | POA: Diagnosis not present

## 2021-12-27 DIAGNOSIS — M25571 Pain in right ankle and joints of right foot: Secondary | ICD-10-CM | POA: Diagnosis not present

## 2021-12-27 DIAGNOSIS — M545 Low back pain, unspecified: Secondary | ICD-10-CM | POA: Diagnosis not present

## 2021-12-27 DIAGNOSIS — M25572 Pain in left ankle and joints of left foot: Secondary | ICD-10-CM | POA: Diagnosis not present

## 2021-12-27 DIAGNOSIS — Z79891 Long term (current) use of opiate analgesic: Secondary | ICD-10-CM | POA: Diagnosis not present

## 2021-12-27 DIAGNOSIS — M5412 Radiculopathy, cervical region: Secondary | ICD-10-CM | POA: Diagnosis not present

## 2021-12-27 DIAGNOSIS — G894 Chronic pain syndrome: Secondary | ICD-10-CM | POA: Diagnosis not present

## 2021-12-27 DIAGNOSIS — M25511 Pain in right shoulder: Secondary | ICD-10-CM | POA: Diagnosis not present

## 2021-12-27 DIAGNOSIS — M542 Cervicalgia: Secondary | ICD-10-CM | POA: Diagnosis not present

## 2021-12-27 DIAGNOSIS — M25512 Pain in left shoulder: Secondary | ICD-10-CM | POA: Diagnosis not present

## 2021-12-27 DIAGNOSIS — K5903 Drug induced constipation: Secondary | ICD-10-CM | POA: Diagnosis not present

## 2021-12-27 DIAGNOSIS — G8929 Other chronic pain: Secondary | ICD-10-CM | POA: Diagnosis not present

## 2021-12-28 ENCOUNTER — Emergency Department (INDEPENDENT_AMBULATORY_CARE_PROVIDER_SITE_OTHER): Payer: Medicare Other

## 2021-12-28 ENCOUNTER — Emergency Department (INDEPENDENT_AMBULATORY_CARE_PROVIDER_SITE_OTHER)
Admission: EM | Admit: 2021-12-28 | Discharge: 2021-12-28 | Disposition: A | Discharge status: Home / Self Care | Payer: Medicare Other | Source: Home / Self Care | Attending: Family Medicine | Admitting: Family Medicine

## 2021-12-28 DIAGNOSIS — M25571 Pain in right ankle and joints of right foot: Secondary | ICD-10-CM

## 2021-12-28 DIAGNOSIS — M79671 Pain in right foot: Secondary | ICD-10-CM

## 2021-12-28 DIAGNOSIS — S92251A Displaced fracture of navicular [scaphoid] of right foot, initial encounter for closed fracture: Secondary | ICD-10-CM

## 2021-12-28 DIAGNOSIS — S92151A Displaced avulsion fracture (chip fracture) of right talus, initial encounter for closed fracture: Secondary | ICD-10-CM | POA: Diagnosis not present

## 2021-12-28 NOTE — Discharge Instructions (Signed)
Use ice and elevation to reduce pain and swelling ?Wear boot while you are walking ?Follow-up with sports medicine.  Dr. Benjamin Stain in your primary care office.  Call for appointment ?
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
## 2021-12-28 NOTE — ED Triage Notes (Signed)
Pt presents with right foot and ankle pain after a fall that occurred 3 days ago. ?

## 2021-12-29 DIAGNOSIS — J9600 Acute respiratory failure, unspecified whether with hypoxia or hypercapnia: Secondary | ICD-10-CM | POA: Diagnosis not present

## 2022-01-11 ENCOUNTER — Ambulatory Visit (INDEPENDENT_AMBULATORY_CARE_PROVIDER_SITE_OTHER): Payer: Medicare Other | Admitting: Sports Medicine

## 2022-01-11 DIAGNOSIS — S92901A Unspecified fracture of right foot, initial encounter for closed fracture: Secondary | ICD-10-CM

## 2022-01-11 DIAGNOSIS — G8929 Other chronic pain: Secondary | ICD-10-CM | POA: Insufficient documentation

## 2022-01-11 DIAGNOSIS — M48062 Spinal stenosis, lumbar region with neurogenic claudication: Secondary | ICD-10-CM

## 2022-01-11 NOTE — Assessment & Plan Note (Signed)
Fall about 1.5 to 2 weeks ago, ultimately seen in urgent care where x-rays showed avulsion fractures from talus, lateral calcaneus and lateral cuboid. ?Swelling has improved in the boot, still has some tenderness at the fracture sites, he does understand this will take 6 to 8 weeks to heal. ?Return to see me in a month, continue boot with minimal weightbearing. ?

## 2022-01-11 NOTE — Progress Notes (Signed)
? ? ?  Procedures performed today:   ? ?None. ? ?Independent interpretation of notes and tests performed by another provider:  ? ?Lumbar spine MRI from 2018 personally reviewed, multilevel degenerative changes, spinal stenosis at L3-L4, L5 nerve root is contacted by the disc at the L3-L4 level intraspinal ? ?Brief History, Exam, Impression, and Recommendations:   ? ?Lumbar spinal stenosis ?Bobby 46 year old Shaw, known lumbar spinal stenosis from an MRI back in 2018, he does have a pain management provider, more recently he is having increasing back pain with radiation down the leg to the second through fourth toes consistent with an L5 radiculopathy. ?L5 nerve root certainly involved at his lumbar spinal stenosis level so we will proceed with a right L5-S1 transforaminal epidural. ?Previous epidurals has been less than efficacious but were at the L3-L4 level. ?Return to see me 1 month after the injection to evaluate relief. ? ?Fracture, foot, right, closed, initial encounter ?Fall about 1.5 to 2 weeks ago, ultimately seen in urgent care where x-rays showed avulsion fractures from talus, lateral calcaneus and lateral cuboid. ?Swelling has improved in the boot, still has some tenderness at the fracture sites, he does understand this will take 6 to 8 weeks to heal. ?Return to see me in a month, continue boot with minimal weightbearing. ? ? ? ?___________________________________________ ?Bobby Shaw. Benjamin Stain, M.D., ABFM., CAQSM. ?Primary Care and Sports Medicine ?[facility] ? ?Adjunct Instructor of Family Medicine  ?[employer] ?

## 2022-01-11 NOTE — Assessment & Plan Note (Signed)
Pleasant 46 year old male, known lumbar spinal stenosis from an MRI back in 2018, he does have a pain management provider, more recently he is having increasing back pain with radiation down the leg to the second through fourth toes consistent with an L5 radiculopathy. ?L5 nerve root certainly involved at his lumbar spinal stenosis level so we will proceed with a right L5-S1 transforaminal epidural. ?Previous epidurals has been less than efficacious but were at the L3-L4 level. ?Return to see me 1 month after the injection to evaluate relief. ?

## 2022-01-14 ENCOUNTER — Other Ambulatory Visit: Payer: Self-pay | Admitting: Family Medicine

## 2022-01-14 DIAGNOSIS — G5701 Lesion of sciatic nerve, right lower limb: Secondary | ICD-10-CM

## 2022-01-17 NOTE — Discharge Instructions (Signed)

## 2022-01-18 ENCOUNTER — Ambulatory Visit
Admission: RE | Admit: 2022-01-18 | Discharge: 2022-01-18 | Disposition: A | Discharge status: Home / Self Care | Payer: Medicare Other | Source: Ambulatory Visit | Attending: Sports Medicine | Admitting: Sports Medicine

## 2022-01-18 DIAGNOSIS — M48062 Spinal stenosis, lumbar region with neurogenic claudication: Secondary | ICD-10-CM

## 2022-01-18 DIAGNOSIS — M4316 Spondylolisthesis, lumbar region: Secondary | ICD-10-CM | POA: Diagnosis not present

## 2022-01-18 MED ORDER — METHYLPREDNISOLONE ACETATE 40 MG/ML INJ SUSP (RADIOLOG
80.0000 mg | Freq: Once | INTRAMUSCULAR | Status: AC
  Administered 2022-01-18: 80 mg via EPIDURAL

## 2022-01-18 MED ORDER — IOPAMIDOL (ISOVUE-M 200) INJECTION 41%
1.0000 mL | Freq: Once | INTRAMUSCULAR | Status: AC
  Administered 2022-01-18: 1 mL via EPIDURAL

## 2022-01-20 DIAGNOSIS — K420 Umbilical hernia with obstruction, without gangrene: Secondary | ICD-10-CM | POA: Diagnosis not present

## 2022-01-20 DIAGNOSIS — I5032 Chronic diastolic (congestive) heart failure: Secondary | ICD-10-CM | POA: Diagnosis not present

## 2022-01-20 DIAGNOSIS — K429 Umbilical hernia without obstruction or gangrene: Secondary | ICD-10-CM | POA: Diagnosis not present

## 2022-01-20 DIAGNOSIS — Z79899 Other long term (current) drug therapy: Secondary | ICD-10-CM | POA: Diagnosis not present

## 2022-01-20 DIAGNOSIS — I110 Hypertensive heart disease with heart failure: Secondary | ICD-10-CM | POA: Diagnosis not present

## 2022-01-20 DIAGNOSIS — I10 Essential (primary) hypertension: Secondary | ICD-10-CM | POA: Diagnosis not present

## 2022-01-20 DIAGNOSIS — E119 Type 2 diabetes mellitus without complications: Secondary | ICD-10-CM | POA: Diagnosis not present

## 2022-01-20 DIAGNOSIS — F17200 Nicotine dependence, unspecified, uncomplicated: Secondary | ICD-10-CM | POA: Diagnosis not present

## 2022-01-20 DIAGNOSIS — F17210 Nicotine dependence, cigarettes, uncomplicated: Secondary | ICD-10-CM | POA: Diagnosis not present

## 2022-01-20 DIAGNOSIS — Z9981 Dependence on supplemental oxygen: Secondary | ICD-10-CM | POA: Diagnosis not present

## 2022-01-24 DIAGNOSIS — M5412 Radiculopathy, cervical region: Secondary | ICD-10-CM | POA: Diagnosis not present

## 2022-01-24 DIAGNOSIS — M25511 Pain in right shoulder: Secondary | ICD-10-CM | POA: Diagnosis not present

## 2022-01-24 DIAGNOSIS — M25571 Pain in right ankle and joints of right foot: Secondary | ICD-10-CM | POA: Diagnosis not present

## 2022-01-24 DIAGNOSIS — M5137 Other intervertebral disc degeneration, lumbosacral region: Secondary | ICD-10-CM | POA: Diagnosis not present

## 2022-01-24 DIAGNOSIS — Z79891 Long term (current) use of opiate analgesic: Secondary | ICD-10-CM | POA: Diagnosis not present

## 2022-01-24 DIAGNOSIS — M25572 Pain in left ankle and joints of left foot: Secondary | ICD-10-CM | POA: Diagnosis not present

## 2022-01-24 DIAGNOSIS — G894 Chronic pain syndrome: Secondary | ICD-10-CM | POA: Diagnosis not present

## 2022-01-24 DIAGNOSIS — M542 Cervicalgia: Secondary | ICD-10-CM | POA: Diagnosis not present

## 2022-01-24 DIAGNOSIS — M545 Low back pain, unspecified: Secondary | ICD-10-CM | POA: Diagnosis not present

## 2022-01-24 DIAGNOSIS — M25512 Pain in left shoulder: Secondary | ICD-10-CM | POA: Diagnosis not present

## 2022-01-28 DIAGNOSIS — J9600 Acute respiratory failure, unspecified whether with hypoxia or hypercapnia: Secondary | ICD-10-CM | POA: Diagnosis not present

## 2022-02-07 ENCOUNTER — Ambulatory Visit (INDEPENDENT_AMBULATORY_CARE_PROVIDER_SITE_OTHER): Payer: Medicare Other | Admitting: Sports Medicine

## 2022-02-07 DIAGNOSIS — S92901A Unspecified fracture of right foot, initial encounter for closed fracture: Secondary | ICD-10-CM

## 2022-02-07 DIAGNOSIS — M48062 Spinal stenosis, lumbar region with neurogenic claudication: Secondary | ICD-10-CM

## 2022-02-07 NOTE — Assessment & Plan Note (Signed)
Bobby Shaw returns, he is a pleasant 46 year old male, multilevel lumbar spinal stenosis from an MRI back in 2018, he does have a pain management provider. ?At the last visit he was increasing pain in the back with radiation down the right leg to the second through fourth toes suspected to be an L5 radiculopathy, we proceeded with a right L5-S1 transforaminal epidural that may have provided only slight relief. ?Considering the multilevel nature of his pathology we can proceed with another epidural, this time right L5-S1 interlaminar. ?We can revisit this in a month after the injection. ?Of note previous injections have not been effective but were higher up at the L3-L4 level. ?

## 2022-02-07 NOTE — Assessment & Plan Note (Signed)
Bobby Shaw is now about 6 weeks post a fall, he was seen in urgent care where x-rays showed avulsion fracture from the talus, lateral calcaneus, lateral cuboid, swelling continues to improve, he does have some discomfort to palpation, but he also has baseline allodynia and hyperalgesia. ?He is not tremendously tender over the fracture locations, he has relatively good motion and strength with relation to his baseline, so I think we can go ahead and get rid of the boot, he will wear athletic shoes and be very cautious with each step. ?

## 2022-02-07 NOTE — Progress Notes (Signed)
? ? ?  Procedures performed today:   ? ?None. ? ?Independent interpretation of notes and tests performed by another provider:  ? ?None. ? ?Brief History, Exam, Impression, and Recommendations:   ? ?Lumbar spinal stenosis ?Bobby Shaw returns, he is a pleasant 46 year old male, multilevel lumbar spinal stenosis from an MRI back in 2018, he does have a pain management provider. ?At the last visit he was increasing pain in the back with radiation down the right leg to the second through fourth toes suspected to be an L5 radiculopathy, we proceeded with a right L5-S1 transforaminal epidural that may have provided only slight relief. ?Considering the multilevel nature of his pathology we can proceed with another epidural, this time right L5-S1 interlaminar. ?We can revisit this in a month after the injection. ?Of note previous injections have not been effective but were higher up at the L3-L4 level. ? ?Fracture, foot, right, closed, initial encounter ?Bobby Shaw is now about 6 weeks post a fall, he was seen in urgent care where x-rays showed avulsion fracture from the talus, lateral calcaneus, lateral cuboid, swelling continues to improve, he does have some discomfort to palpation, but he also has baseline allodynia and hyperalgesia. ?He is not tremendously tender over the fracture locations, he has relatively good motion and strength with relation to his baseline, so I think we can go ahead and get rid of the boot, he will wear athletic shoes and be very cautious with each step. ? ? ? ?___________________________________________ ?[name]. Benjamin Stain, M.D., ABFM., CAQSM. ?Primary Care and Sports Medicine ?[facility] ? ?Adjunct Instructor of Family Medicine  ?[employer] ?

## 2022-02-08 DIAGNOSIS — Z09 Encounter for follow-up examination after completed treatment for conditions other than malignant neoplasm: Secondary | ICD-10-CM | POA: Diagnosis not present

## 2022-02-10 ENCOUNTER — Ambulatory Visit: Payer: Medicare Other | Admitting: Sports Medicine

## 2022-02-16 ENCOUNTER — Other Ambulatory Visit: Payer: Self-pay | Admitting: Family Medicine

## 2022-02-21 ENCOUNTER — Encounter (INDEPENDENT_AMBULATORY_CARE_PROVIDER_SITE_OTHER): Payer: Medicare Other | Admitting: Sports Medicine

## 2022-02-21 DIAGNOSIS — S92901A Unspecified fracture of right foot, initial encounter for closed fracture: Secondary | ICD-10-CM | POA: Diagnosis not present

## 2022-02-21 DIAGNOSIS — G8929 Other chronic pain: Secondary | ICD-10-CM

## 2022-02-21 NOTE — Telephone Encounter (Signed)
I spent 5 total minutes of online digital evaluation and management services in this patient-initiated request for online care. 

## 2022-02-22 NOTE — Addendum Note (Signed)
Addended by: Silverio Decamp on: 02/22/2022 01:15 PM   Modules accepted: Orders

## 2022-02-23 ENCOUNTER — Ambulatory Visit: Payer: Medicare Other | Admitting: Sports Medicine

## 2022-02-27 ENCOUNTER — Ambulatory Visit (INDEPENDENT_AMBULATORY_CARE_PROVIDER_SITE_OTHER): Payer: Medicare Other

## 2022-02-27 DIAGNOSIS — M79671 Pain in right foot: Secondary | ICD-10-CM | POA: Diagnosis not present

## 2022-02-27 DIAGNOSIS — S92901A Unspecified fracture of right foot, initial encounter for closed fracture: Secondary | ICD-10-CM

## 2022-02-27 DIAGNOSIS — G8929 Other chronic pain: Secondary | ICD-10-CM | POA: Diagnosis not present

## 2022-02-28 DIAGNOSIS — M5126 Other intervertebral disc displacement, lumbar region: Secondary | ICD-10-CM | POA: Diagnosis not present

## 2022-02-28 DIAGNOSIS — M5412 Radiculopathy, cervical region: Secondary | ICD-10-CM | POA: Diagnosis not present

## 2022-02-28 DIAGNOSIS — J9600 Acute respiratory failure, unspecified whether with hypoxia or hypercapnia: Secondary | ICD-10-CM | POA: Diagnosis not present

## 2022-02-28 DIAGNOSIS — M545 Low back pain, unspecified: Secondary | ICD-10-CM | POA: Diagnosis not present

## 2022-02-28 DIAGNOSIS — K5903 Drug induced constipation: Secondary | ICD-10-CM | POA: Diagnosis not present

## 2022-02-28 DIAGNOSIS — M25571 Pain in right ankle and joints of right foot: Secondary | ICD-10-CM | POA: Diagnosis not present

## 2022-02-28 DIAGNOSIS — M542 Cervicalgia: Secondary | ICD-10-CM | POA: Diagnosis not present

## 2022-02-28 DIAGNOSIS — Z79891 Long term (current) use of opiate analgesic: Secondary | ICD-10-CM | POA: Diagnosis not present

## 2022-02-28 DIAGNOSIS — M25512 Pain in left shoulder: Secondary | ICD-10-CM | POA: Diagnosis not present

## 2022-02-28 DIAGNOSIS — G894 Chronic pain syndrome: Secondary | ICD-10-CM | POA: Diagnosis not present

## 2022-02-28 DIAGNOSIS — M25511 Pain in right shoulder: Secondary | ICD-10-CM | POA: Diagnosis not present

## 2022-02-28 DIAGNOSIS — M25572 Pain in left ankle and joints of left foot: Secondary | ICD-10-CM | POA: Diagnosis not present

## 2022-02-28 DIAGNOSIS — M5136 Other intervertebral disc degeneration, lumbar region: Secondary | ICD-10-CM | POA: Diagnosis not present

## 2022-03-01 ENCOUNTER — Ambulatory Visit
Admission: RE | Admit: 2022-03-01 | Discharge: 2022-03-01 | Disposition: A | Discharge status: Home / Self Care | Payer: Medicare Other | Source: Ambulatory Visit | Attending: Sports Medicine | Admitting: Sports Medicine

## 2022-03-01 DIAGNOSIS — M48062 Spinal stenosis, lumbar region with neurogenic claudication: Secondary | ICD-10-CM

## 2022-03-01 MED ORDER — IOPAMIDOL (ISOVUE-M 200) INJECTION 41%
1.0000 mL | Freq: Once | INTRAMUSCULAR | Status: AC
  Administered 2022-03-01: 1 mL via EPIDURAL

## 2022-03-01 MED ORDER — METHYLPREDNISOLONE ACETATE 40 MG/ML INJ SUSP (RADIOLOG
80.0000 mg | Freq: Once | INTRAMUSCULAR | Status: AC
  Administered 2022-03-01: 80 mg via EPIDURAL

## 2022-03-01 NOTE — Discharge Instructions (Signed)

## 2022-03-03 ENCOUNTER — Other Ambulatory Visit: Payer: Self-pay | Admitting: Family Medicine

## 2022-03-03 DIAGNOSIS — G5701 Lesion of sciatic nerve, right lower limb: Secondary | ICD-10-CM

## 2022-03-04 ENCOUNTER — Other Ambulatory Visit: Payer: Self-pay | Admitting: Osteopathic Medicine

## 2022-03-04 DIAGNOSIS — I10 Essential (primary) hypertension: Secondary | ICD-10-CM

## 2022-03-14 ENCOUNTER — Ambulatory Visit: Payer: Medicare Other | Admitting: Sports Medicine

## 2022-03-25 ENCOUNTER — Other Ambulatory Visit: Payer: Self-pay | Admitting: Family Medicine

## 2022-03-25 DIAGNOSIS — G5701 Lesion of sciatic nerve, right lower limb: Secondary | ICD-10-CM

## 2022-03-29 DIAGNOSIS — M545 Low back pain, unspecified: Secondary | ICD-10-CM | POA: Diagnosis not present

## 2022-03-29 DIAGNOSIS — M542 Cervicalgia: Secondary | ICD-10-CM | POA: Diagnosis not present

## 2022-03-29 DIAGNOSIS — M25571 Pain in right ankle and joints of right foot: Secondary | ICD-10-CM | POA: Diagnosis not present

## 2022-03-29 DIAGNOSIS — M25572 Pain in left ankle and joints of left foot: Secondary | ICD-10-CM | POA: Diagnosis not present

## 2022-03-29 DIAGNOSIS — M25512 Pain in left shoulder: Secondary | ICD-10-CM | POA: Diagnosis not present

## 2022-03-29 DIAGNOSIS — G894 Chronic pain syndrome: Secondary | ICD-10-CM | POA: Diagnosis not present

## 2022-03-29 DIAGNOSIS — M5137 Other intervertebral disc degeneration, lumbosacral region: Secondary | ICD-10-CM | POA: Diagnosis not present

## 2022-03-29 DIAGNOSIS — K5903 Drug induced constipation: Secondary | ICD-10-CM | POA: Diagnosis not present

## 2022-03-29 DIAGNOSIS — M5412 Radiculopathy, cervical region: Secondary | ICD-10-CM | POA: Diagnosis not present

## 2022-03-29 DIAGNOSIS — M25511 Pain in right shoulder: Secondary | ICD-10-CM | POA: Diagnosis not present

## 2022-03-29 DIAGNOSIS — Z79891 Long term (current) use of opiate analgesic: Secondary | ICD-10-CM | POA: Diagnosis not present

## 2022-03-29 DIAGNOSIS — M5136 Other intervertebral disc degeneration, lumbar region: Secondary | ICD-10-CM | POA: Diagnosis not present

## 2022-03-30 DIAGNOSIS — J9600 Acute respiratory failure, unspecified whether with hypoxia or hypercapnia: Secondary | ICD-10-CM | POA: Diagnosis not present

## 2022-04-13 ENCOUNTER — Ambulatory Visit (INDEPENDENT_AMBULATORY_CARE_PROVIDER_SITE_OTHER): Payer: Medicare Other | Admitting: Sports Medicine

## 2022-04-13 DIAGNOSIS — G8929 Other chronic pain: Secondary | ICD-10-CM | POA: Diagnosis not present

## 2022-04-13 DIAGNOSIS — M79671 Pain in right foot: Secondary | ICD-10-CM | POA: Diagnosis not present
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
## 2022-04-24 ENCOUNTER — Other Ambulatory Visit: Payer: Self-pay | Admitting: Family Medicine

## 2022-04-24 DIAGNOSIS — G5701 Lesion of sciatic nerve, right lower limb: Secondary | ICD-10-CM

## 2022-04-30 DIAGNOSIS — J9600 Acute respiratory failure, unspecified whether with hypoxia or hypercapnia: Secondary | ICD-10-CM | POA: Diagnosis not present

## 2022-05-05 DIAGNOSIS — G5791 Unspecified mononeuropathy of right lower limb: Secondary | ICD-10-CM | POA: Diagnosis not present

## 2022-05-09 DIAGNOSIS — M5137 Other intervertebral disc degeneration, lumbosacral region: Secondary | ICD-10-CM | POA: Diagnosis not present

## 2022-05-09 DIAGNOSIS — M25571 Pain in right ankle and joints of right foot: Secondary | ICD-10-CM | POA: Diagnosis not present

## 2022-05-09 DIAGNOSIS — M25512 Pain in left shoulder: Secondary | ICD-10-CM | POA: Diagnosis not present

## 2022-05-09 DIAGNOSIS — M545 Low back pain, unspecified: Secondary | ICD-10-CM | POA: Diagnosis not present

## 2022-05-09 DIAGNOSIS — Z79891 Long term (current) use of opiate analgesic: Secondary | ICD-10-CM | POA: Diagnosis not present

## 2022-05-09 DIAGNOSIS — M25572 Pain in left ankle and joints of left foot: Secondary | ICD-10-CM | POA: Diagnosis not present

## 2022-05-09 DIAGNOSIS — M542 Cervicalgia: Secondary | ICD-10-CM | POA: Diagnosis not present

## 2022-05-09 DIAGNOSIS — M25511 Pain in right shoulder: Secondary | ICD-10-CM | POA: Diagnosis not present

## 2022-05-09 DIAGNOSIS — G894 Chronic pain syndrome: Secondary | ICD-10-CM | POA: Diagnosis not present

## 2022-05-09 DIAGNOSIS — K5903 Drug induced constipation: Secondary | ICD-10-CM | POA: Diagnosis not present

## 2022-05-09 DIAGNOSIS — M5412 Radiculopathy, cervical region: Secondary | ICD-10-CM | POA: Diagnosis not present

## 2022-05-09 DIAGNOSIS — M5126 Other intervertebral disc displacement, lumbar region: Secondary | ICD-10-CM | POA: Diagnosis not present

## 2022-05-10 ENCOUNTER — Other Ambulatory Visit: Payer: Self-pay | Admitting: Osteopathic Medicine

## 2022-05-10 DIAGNOSIS — G4709 Other insomnia: Secondary | ICD-10-CM

## 2022-05-10 DIAGNOSIS — Z8659 Personal history of other mental and behavioral disorders: Secondary | ICD-10-CM

## 2022-05-17 ENCOUNTER — Encounter: Payer: Self-pay | Admitting: General Practice

## 2022-05-18 ENCOUNTER — Ambulatory Visit (INDEPENDENT_AMBULATORY_CARE_PROVIDER_SITE_OTHER): Payer: Medicare Other | Admitting: Sports Medicine

## 2022-05-18 DIAGNOSIS — M48062 Spinal stenosis, lumbar region with neurogenic claudication: Secondary | ICD-10-CM

## 2022-05-18 NOTE — Assessment & Plan Note (Addendum)
Bobby Shaw returns, he is a very pleasant 46 year old male, multilevel lumbar spinal stenosis from an MRI in 2018, he does have a pain management provider. 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 He has pain low back radiation down the right leg, second through fourth toes initially suspected to be an L5 radiculopathy, a right L5-S1 transforaminal epidural only provided slight relief. He would like to revisit physical therapy, I will refer him back to [facility] rehab specialists.

## 2022-05-18 NOTE — Progress Notes (Signed)
    Procedures performed today:    None.  Independent interpretation of notes and tests performed by another provider:   None.  Brief History, Exam, Impression, and Recommendations:    Lumbar spinal stenosis Bobby Shaw returns, he is a very pleasant 46 year old male, multilevel lumbar spinal stenosis from an MRI in 2018, he does have a pain management provider. 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 He has pain low back radiation down the right leg, second through fourth toes initially suspected to be an L5 radiculopathy, a right L5-S1 transforaminal epidural only provided slight relief. He would like to revisit physical therapy, I will refer him back to [facility] rehab specialists.    ____________________________________________ [name]. Benjamin Stain, M.D., ABFM., CAQSM., AME. Primary Care and Sports Medicine [facility]  Adjunct Professor of Family Medicine  [employer]  Restaurant manager, fast food

## 2022-05-22 ENCOUNTER — Other Ambulatory Visit: Payer: Self-pay | Admitting: Family Medicine

## 2022-05-22 DIAGNOSIS — G5701 Lesion of sciatic nerve, right lower limb: Secondary | ICD-10-CM

## 2022-05-24 ENCOUNTER — Ambulatory Visit (INDEPENDENT_AMBULATORY_CARE_PROVIDER_SITE_OTHER): Payer: Medicare Other | Admitting: Family Medicine

## 2022-05-24 ENCOUNTER — Ambulatory Visit (INDEPENDENT_AMBULATORY_CARE_PROVIDER_SITE_OTHER): Payer: Medicare Other

## 2022-05-24 ENCOUNTER — Encounter: Payer: Self-pay | Admitting: Family Medicine

## 2022-05-24 ENCOUNTER — Other Ambulatory Visit: Payer: Self-pay | Admitting: Family Medicine

## 2022-05-24 VITALS — BP 123/74 | HR 94 | Ht 69.0 in | Wt 199.0 lb

## 2022-05-24 DIAGNOSIS — F17210 Nicotine dependence, cigarettes, uncomplicated: Secondary | ICD-10-CM

## 2022-05-24 DIAGNOSIS — R0600 Dyspnea, unspecified: Secondary | ICD-10-CM | POA: Diagnosis not present

## 2022-05-24 DIAGNOSIS — R350 Frequency of micturition: Secondary | ICD-10-CM | POA: Diagnosis not present

## 2022-05-24 DIAGNOSIS — Z720 Tobacco use: Secondary | ICD-10-CM | POA: Diagnosis not present

## 2022-05-24 DIAGNOSIS — R0602 Shortness of breath: Secondary | ICD-10-CM

## 2022-05-24 DIAGNOSIS — E119 Type 2 diabetes mellitus without complications: Secondary | ICD-10-CM

## 2022-05-24 DIAGNOSIS — I10 Essential (primary) hypertension: Secondary | ICD-10-CM | POA: Diagnosis not present

## 2022-05-24 DIAGNOSIS — J069 Acute upper respiratory infection, unspecified: Secondary | ICD-10-CM | POA: Diagnosis not present

## 2022-05-24 LAB — POCT URINALYSIS DIP (CLINITEK)
Bilirubin, UA: NEGATIVE
Blood, UA: NEGATIVE
Glucose, UA: NEGATIVE mg/dL
Ketones, POC UA: NEGATIVE mg/dL
Leukocytes, UA: NEGATIVE
Nitrite, UA: NEGATIVE
POC PROTEIN,UA: NEGATIVE
Spec Grav, UA: 1.02 (ref 1.010–1.025)
Urobilinogen, UA: 1 E.U./dL
pH, UA: 7 (ref 5.0–8.0)

## 2022-05-24 LAB — POCT GLYCOSYLATED HEMOGLOBIN (HGB A1C): HbA1c, POC (prediabetic range): 5.7 % (ref 5.7–6.4)

## 2022-05-24 LAB — POCT UA - MICROALBUMIN
Albumin/Creatinine Ratio, Urine, POC: <30
Creatinine, POC: 200 mg/dL
Microalbumin Ur, POC: 10 mg/L

## 2022-05-24 NOTE — Patient Instructions (Signed)
Continue current medications.  Have xray completed.  Follow up in 6 months.

## 2022-05-24 NOTE — Assessment & Plan Note (Signed)
Blood pressures well controlled at this time.  Recommend continuation of losartan.

## 2022-05-24 NOTE — Assessment & Plan Note (Signed)
Check urinalysis and PSA. °

## 2022-05-24 NOTE — Assessment & Plan Note (Signed)
Blood sugars remain well controlled at this time.  He will continue current medications for management of his diabetes.

## 2022-05-24 NOTE — Assessment & Plan Note (Addendum)
Counseled on smoking cessation.  Chest x-ray ordered today.
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
## 2022-05-25 LAB — CBC WITH DIFFERENTIAL/PLATELET
Absolute Monocytes: 439 cells/uL (ref 200–950)
Basophils Absolute: 21 cells/uL (ref 0–200)
Basophils Relative: 0.2 %
Eosinophils Absolute: 32 cells/uL (ref 15–500)
Eosinophils Relative: 0.3 %
HCT: 51.7 % — ABNORMAL HIGH (ref 38.5–50.0)
Hemoglobin: 17.8 g/dL — ABNORMAL HIGH (ref 13.2–17.1)
Lymphs Abs: 2033 cells/uL (ref 850–3900)
MCH: 32.2 pg (ref 27.0–33.0)
MCHC: 34.4 g/dL (ref 32.0–36.0)
MCV: 93.5 fL (ref 80.0–100.0)
MPV: 9.8 fL (ref 7.5–12.5)
Monocytes Relative: 4.1 %
Neutro Abs: 8175 cells/uL — ABNORMAL HIGH (ref 1500–7800)
Neutrophils Relative %: 76.4 %
Platelets: 226 10*3/uL (ref 140–400)
RBC: 5.53 10*6/uL (ref 4.20–5.80)
RDW: 11.8 % (ref 11.0–15.0)
Total Lymphocyte: 19 %
WBC: 10.7 10*3/uL (ref 3.8–10.8)

## 2022-05-25 LAB — COMPLETE METABOLIC PANEL WITH GFR
AG Ratio: 2 (calc) (ref 1.0–2.5)
ALT: 9 U/L (ref 9–46)
AST: 12 U/L (ref 10–40)
Albumin: 4.7 g/dL (ref 3.6–5.1)
Alkaline phosphatase (APISO): 90 U/L (ref 36–130)
BUN: 7 mg/dL (ref 7–25)
CO2: 30 mmol/L (ref 20–32)
Calcium: 9.9 mg/dL (ref 8.6–10.3)
Chloride: 101 mmol/L (ref 98–110)
Creat: 0.7 mg/dL (ref 0.60–1.29)
Globulin: 2.3 g/dL (calc) (ref 1.9–3.7)
Glucose, Bld: 128 mg/dL (ref 65–139)
Potassium: 5.1 mmol/L (ref 3.5–5.3)
Sodium: 140 mmol/L (ref 135–146)
Total Bilirubin: 0.7 mg/dL (ref 0.2–1.2)
Total Protein: 7 g/dL (ref 6.1–8.1)
eGFR: 117 mL/min/{1.73_m2} (ref >=60)

## 2022-05-25 LAB — PSA: PSA: 0.89 ng/mL (ref <=4.00)

## 2022-05-25 LAB — LIPID PANEL W/REFLEX DIRECT LDL
Cholesterol: 254 mg/dL — ABNORMAL HIGH (ref <200)
HDL: 36 mg/dL — ABNORMAL LOW (ref >=40)
LDL Cholesterol (Calc): 172 mg/dL (calc) — ABNORMAL HIGH
Non-HDL Cholesterol (Calc): 218 mg/dL (calc) — ABNORMAL HIGH (ref <130)
Total CHOL/HDL Ratio: 7.1 (calc) — ABNORMAL HIGH (ref <5.0)
Triglycerides: 283 mg/dL — ABNORMAL HIGH (ref <150)

## 2022-05-26 ENCOUNTER — Telehealth: Payer: Self-pay | Admitting: General Practice

## 2022-05-26 ENCOUNTER — Other Ambulatory Visit: Payer: Self-pay | Admitting: Family Medicine

## 2022-05-26 MED ORDER — TADALAFIL 20 MG PO TABS: 10.0000 mg | ORAL_TABLET | ORAL | 2 refills | Status: AC | PRN

## 2022-05-26 MED ORDER — ATORVASTATIN CALCIUM 40 MG PO TABS: 40.0000 mg | ORAL_TABLET | Freq: Every day | ORAL | 2 refills | Status: DC

## 2022-05-26 NOTE — Telephone Encounter (Signed)
error 

## 2022-05-29 NOTE — Addendum Note (Signed)
Addended by: Mammie Lorenzo on: 05/29/2022 08:31 PM   Modules accepted: Orders

## 2022-05-30 ENCOUNTER — Other Ambulatory Visit: Payer: Self-pay | Admitting: Family Medicine

## 2022-05-30 MED ORDER — VALACYCLOVIR HCL 500 MG PO TABS: 500.0000 mg | ORAL_TABLET | Freq: Every day | ORAL | 1 refills | Status: DC

## 2022-05-31 DIAGNOSIS — J9600 Acute respiratory failure, unspecified whether with hypoxia or hypercapnia: Secondary | ICD-10-CM | POA: Diagnosis not present

## 2022-06-05 MED ORDER — VALACYCLOVIR HCL 1 G PO TABS: 1000.0000 mg | ORAL_TABLET | Freq: Two times a day (BID) | ORAL | 0 refills | Status: AC

## 2022-06-05 NOTE — Addendum Note (Signed)
Addended by: Mammie Lorenzo on: 06/05/2022 10:02 AM   Modules accepted: Orders

## 2022-06-09 DIAGNOSIS — M5126 Other intervertebral disc displacement, lumbar region: Secondary | ICD-10-CM | POA: Diagnosis not present

## 2022-06-09 DIAGNOSIS — G894 Chronic pain syndrome: Secondary | ICD-10-CM | POA: Diagnosis not present

## 2022-06-09 DIAGNOSIS — M25512 Pain in left shoulder: Secondary | ICD-10-CM | POA: Diagnosis not present

## 2022-06-09 DIAGNOSIS — M542 Cervicalgia: Secondary | ICD-10-CM | POA: Diagnosis not present

## 2022-06-09 DIAGNOSIS — M25572 Pain in left ankle and joints of left foot: Secondary | ICD-10-CM | POA: Diagnosis not present

## 2022-06-09 DIAGNOSIS — K5903 Drug induced constipation: Secondary | ICD-10-CM | POA: Diagnosis not present

## 2022-06-09 DIAGNOSIS — M25511 Pain in right shoulder: Secondary | ICD-10-CM | POA: Diagnosis not present

## 2022-06-09 DIAGNOSIS — G5790 Unspecified mononeuropathy of unspecified lower limb: Secondary | ICD-10-CM | POA: Diagnosis not present

## 2022-06-09 DIAGNOSIS — Z79891 Long term (current) use of opiate analgesic: Secondary | ICD-10-CM | POA: Diagnosis not present

## 2022-06-09 DIAGNOSIS — M545 Low back pain, unspecified: Secondary | ICD-10-CM | POA: Diagnosis not present

## 2022-06-09 DIAGNOSIS — M25571 Pain in right ankle and joints of right foot: Secondary | ICD-10-CM | POA: Diagnosis not present

## 2022-06-09 DIAGNOSIS — M5412 Radiculopathy, cervical region: Secondary | ICD-10-CM | POA: Diagnosis not present

## 2022-06-09 DIAGNOSIS — M5137 Other intervertebral disc degeneration, lumbosacral region: Secondary | ICD-10-CM | POA: Diagnosis not present

## 2022-06-15 ENCOUNTER — Encounter: Payer: Self-pay | Admitting: Sports Medicine

## 2022-06-15 DIAGNOSIS — M48062 Spinal stenosis, lumbar region with neurogenic claudication: Secondary | ICD-10-CM

## 2022-06-16 ENCOUNTER — Telehealth: Payer: Self-pay

## 2022-06-16 NOTE — Telephone Encounter (Signed)
LVM for pt to call and schedule  °

## 2022-06-16 NOTE — Telephone Encounter (Signed)
Pt lvm requesting additional Seroquel due to increased anxiety during the daytime.    Pt received 90 tabs on 05/10/22.   Please contact the patient to schedule an appt with Dr. Zigmund Daniel concerning anxiety and increased medication. Thanks

## 2022-06-23 DIAGNOSIS — M5136 Other intervertebral disc degeneration, lumbar region: Secondary | ICD-10-CM | POA: Diagnosis not present

## 2022-06-23 DIAGNOSIS — M25512 Pain in left shoulder: Secondary | ICD-10-CM | POA: Diagnosis not present

## 2022-06-23 DIAGNOSIS — M5412 Radiculopathy, cervical region: Secondary | ICD-10-CM | POA: Diagnosis not present

## 2022-06-23 DIAGNOSIS — K5903 Drug induced constipation: Secondary | ICD-10-CM | POA: Diagnosis not present

## 2022-06-23 DIAGNOSIS — M25572 Pain in left ankle and joints of left foot: Secondary | ICD-10-CM | POA: Diagnosis not present

## 2022-06-23 DIAGNOSIS — M5137 Other intervertebral disc degeneration, lumbosacral region: Secondary | ICD-10-CM | POA: Diagnosis not present

## 2022-06-23 DIAGNOSIS — M542 Cervicalgia: Secondary | ICD-10-CM | POA: Diagnosis not present

## 2022-06-23 DIAGNOSIS — M545 Low back pain, unspecified: Secondary | ICD-10-CM | POA: Diagnosis not present

## 2022-06-23 DIAGNOSIS — M25511 Pain in right shoulder: Secondary | ICD-10-CM | POA: Diagnosis not present

## 2022-06-23 DIAGNOSIS — M25571 Pain in right ankle and joints of right foot: Secondary | ICD-10-CM | POA: Diagnosis not present

## 2022-06-23 DIAGNOSIS — G894 Chronic pain syndrome: Secondary | ICD-10-CM | POA: Diagnosis not present

## 2022-06-23 DIAGNOSIS — Z79891 Long term (current) use of opiate analgesic: Secondary | ICD-10-CM | POA: Diagnosis not present

## 2022-06-26 ENCOUNTER — Ambulatory Visit: Payer: Medicare Other | Admitting: Sports Medicine

## 2022-06-26 NOTE — Telephone Encounter (Signed)
Referral placed.

## 2022-06-30 DIAGNOSIS — J9600 Acute respiratory failure, unspecified whether with hypoxia or hypercapnia: Secondary | ICD-10-CM | POA: Diagnosis not present

## 2022-07-04 ENCOUNTER — Ambulatory Visit (INDEPENDENT_AMBULATORY_CARE_PROVIDER_SITE_OTHER): Payer: Medicare Other | Admitting: Family Medicine

## 2022-07-04 ENCOUNTER — Encounter: Payer: Self-pay | Admitting: Physical Therapy

## 2022-07-04 ENCOUNTER — Encounter: Payer: Self-pay | Admitting: Family Medicine

## 2022-07-04 ENCOUNTER — Ambulatory Visit: Payer: Medicare Other | Attending: Sports Medicine | Admitting: Physical Therapy

## 2022-07-04 ENCOUNTER — Other Ambulatory Visit: Payer: Self-pay

## 2022-07-04 VITALS — BP 105/63 | HR 79 | Ht 69.0 in | Wt 202.0 lb

## 2022-07-04 DIAGNOSIS — M48062 Spinal stenosis, lumbar region with neurogenic claudication: Secondary | ICD-10-CM | POA: Diagnosis present

## 2022-07-04 DIAGNOSIS — Z91199 Patient's noncompliance with other medical treatment and regimen due to unspecified reason: Secondary | ICD-10-CM

## 2022-07-04 DIAGNOSIS — G8929 Other chronic pain: Secondary | ICD-10-CM

## 2022-07-04 DIAGNOSIS — M6281 Muscle weakness (generalized): Secondary | ICD-10-CM

## 2022-07-04 DIAGNOSIS — R29898 Other symptoms and signs involving the musculoskeletal system: Secondary | ICD-10-CM
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
## 2022-07-05 NOTE — Progress Notes (Signed)
Patient eloped prior to being seen

## 2022-07-10 ENCOUNTER — Ambulatory Visit: Payer: Medicare Other | Admitting: Rehabilitative and Restorative Service Providers"

## 2022-07-12 ENCOUNTER — Telehealth (INDEPENDENT_AMBULATORY_CARE_PROVIDER_SITE_OTHER): Payer: Medicare Other | Admitting: Family Medicine

## 2022-07-12 ENCOUNTER — Ambulatory Visit: Payer: Medicare Other | Admitting: Physical Therapy

## 2022-07-12 DIAGNOSIS — F3131 Bipolar disorder, current episode depressed, mild: Secondary | ICD-10-CM | POA: Diagnosis not present

## 2022-07-12 DIAGNOSIS — G5701 Lesion of sciatic nerve, right lower limb: Secondary | ICD-10-CM

## 2022-07-12 DIAGNOSIS — G4709 Other insomnia: Secondary | ICD-10-CM

## 2022-07-12 DIAGNOSIS — Z8659 Personal history of other mental and behavioral disorders: Secondary | ICD-10-CM

## 2022-07-12 MED ORDER — DULOXETINE HCL 30 MG PO CPEP: 30.0000 mg | ORAL_CAPSULE | Freq: Every day | ORAL | 1 refills | Status: DC

## 2022-07-12 MED ORDER — QUETIAPINE FUMARATE 100 MG PO TABS: 100.0000 mg | ORAL_TABLET | Freq: Every day | ORAL | 1 refills | Status: DC

## 2022-07-12 NOTE — Progress Notes (Signed)
Called pt and LVM advising him that we were calling him to do his prescreening prior to him speaking to Dr. Zigmund Daniel.
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
## 2022-07-13 ENCOUNTER — Ambulatory Visit: Payer: Medicare Other | Admitting: Physical Therapy

## 2022-07-13 ENCOUNTER — Encounter: Payer: Self-pay | Admitting: Physical Therapy

## 2022-07-13 DIAGNOSIS — G8929 Other chronic pain: Secondary | ICD-10-CM

## 2022-07-13 DIAGNOSIS — M6281 Muscle weakness (generalized): Secondary | ICD-10-CM

## 2022-07-13 DIAGNOSIS — R29898 Other symptoms and signs involving the musculoskeletal system: Secondary | ICD-10-CM

## 2022-07-13 DIAGNOSIS — M48062 Spinal stenosis, lumbar region with neurogenic claudication: Secondary | ICD-10-CM | POA: Diagnosis not present

## 2022-07-14 ENCOUNTER — Encounter: Payer: Self-pay | Admitting: Family Medicine

## 2022-07-14 NOTE — Assessment & Plan Note (Signed)
He has noted some increased manic symptoms.  Difficulty sleeping.  Increasing Seroquel to 100 mg nightly.  We can add an additional daytime dose if needed.
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
## 2022-07-14 NOTE — Assessment & Plan Note (Signed)
He has been taking duloxetine but does not feel like this is incredibly helpful.  He will start weaning himself from this as he feels like this may be contributing to some of his mania.  Currently at 60 mg daily.  He will decrease this further to 30 mg time sent updated prescription in.  He can space this out to every other day after a few weeks as well.

## 2022-07-17 ENCOUNTER — Ambulatory Visit: Payer: Medicare Other | Admitting: Rehabilitative and Restorative Service Providers"
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
## 2022-07-18 ENCOUNTER — Ambulatory Visit: Payer: Medicare Other | Admitting: Physical Therapy

## 2022-07-18 ENCOUNTER — Encounter: Payer: Self-pay | Admitting: Physical Therapy

## 2022-07-18 DIAGNOSIS — M48062 Spinal stenosis, lumbar region with neurogenic claudication: Secondary | ICD-10-CM | POA: Diagnosis not present

## 2022-07-18 DIAGNOSIS — R29898 Other symptoms and signs involving the musculoskeletal system: Secondary | ICD-10-CM

## 2022-07-18 DIAGNOSIS — G8929 Other chronic pain: Secondary | ICD-10-CM

## 2022-07-18 DIAGNOSIS — M6281 Muscle weakness (generalized): Secondary | ICD-10-CM

## 2022-07-19 ENCOUNTER — Ambulatory Visit: Payer: Medicare Other | Admitting: Physical Therapy

## 2022-07-20 ENCOUNTER — Ambulatory Visit: Payer: Medicare Other | Admitting: Physical Therapy
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
## 2022-07-24 ENCOUNTER — Ambulatory Visit: Payer: Medicare Other | Admitting: Physical Therapy

## 2022-07-25 DIAGNOSIS — M25512 Pain in left shoulder: Secondary | ICD-10-CM | POA: Diagnosis not present

## 2022-07-25 DIAGNOSIS — M25572 Pain in left ankle and joints of left foot: Secondary | ICD-10-CM | POA: Diagnosis not present

## 2022-07-25 DIAGNOSIS — Z79891 Long term (current) use of opiate analgesic: Secondary | ICD-10-CM | POA: Diagnosis not present

## 2022-07-25 DIAGNOSIS — M25511 Pain in right shoulder: Secondary | ICD-10-CM | POA: Diagnosis not present

## 2022-07-25 DIAGNOSIS — M545 Low back pain, unspecified: Secondary | ICD-10-CM | POA: Diagnosis not present

## 2022-07-25 DIAGNOSIS — M542 Cervicalgia: Secondary | ICD-10-CM | POA: Diagnosis not present

## 2022-07-25 DIAGNOSIS — G894 Chronic pain syndrome: Secondary | ICD-10-CM | POA: Diagnosis not present

## 2022-07-25 DIAGNOSIS — M5412 Radiculopathy, cervical region: Secondary | ICD-10-CM | POA: Diagnosis not present

## 2022-07-25 DIAGNOSIS — M25571 Pain in right ankle and joints of right foot: Secondary | ICD-10-CM | POA: Diagnosis not present

## 2022-07-26 ENCOUNTER — Ambulatory Visit: Payer: Medicare Other | Admitting: Physical Therapy

## 2022-07-27 ENCOUNTER — Ambulatory Visit: Payer: Medicare Other | Attending: Sports Medicine | Admitting: Rehabilitative and Restorative Service Providers"

## 2022-07-27 ENCOUNTER — Encounter: Payer: Self-pay | Admitting: Rehabilitative and Restorative Service Providers"

## 2022-07-27 DIAGNOSIS — M5441 Lumbago with sciatica, right side: Secondary | ICD-10-CM | POA: Insufficient documentation

## 2022-07-27 DIAGNOSIS — R29898 Other symptoms and signs involving the musculoskeletal system: Secondary | ICD-10-CM | POA: Insufficient documentation

## 2022-07-27 DIAGNOSIS — G8929 Other chronic pain: Secondary | ICD-10-CM | POA: Diagnosis present

## 2022-07-27 DIAGNOSIS — M542 Cervicalgia: Secondary | ICD-10-CM | POA: Insufficient documentation

## 2022-07-27 DIAGNOSIS — M6281 Muscle weakness (generalized): Secondary | ICD-10-CM | POA: Diagnosis present
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
## 2022-07-27 NOTE — Patient Instructions (Signed)
Aquatic Therapy: What to Expect!  Where:  [facility] [address] [place], [place] [address] [phone]           How to Prepare:  If you require assistance with dressing, with transportation (ie: wheel chair), or toileting, a caregiver must attend the entire session with you (unless your primary therapists feels this is not necessary).   If there is thunder during your appointment, you will be asked to leave pool area. You have the option to finish your session in the physical therapy area near the gym. Masks in the pool area are optional. Your face will remain dry during your session, so you are welcome to keep your mask on, if desired. You will be spaced at least 6 feet from other aquatic patients.  Please bring your own swim towel to dry off with.   There are Men's and Women's locker rooms with showers, as well as gender neutral bathrooms in the pool area.  Please arrive IN YOUR SUIT and a few minutes prior to your appointment - this helps to avoid delays in starting your session. Head to the pool and await your appointment on the bench on the pool deck. Please make sure to attend to any toileting needs prior to entering the pool. Once on the pool deck your therapist will ask you to sign the Patient  Consent and Assignment of Benefits form. Your therapist may take your blood pressure prior to, during and after your session if indicated. We usually try and create a home exercise program based on activities we do in the pool. Some patients do not want to or do not have the ability to participate in an aquatic home program - this is not a barrier in any way to you participating in aquatic therapy as part of your current therapy plan!  Appointments:  All sessions are 45 minutes  About the pool: Entering the pool: Your therapist will assist you if needed; there are two ways to enter the pool - stairs or a mechanical lift. Your therapist will determine  the most appropriate way for you. Water temperature is usually around 91-95.  There is a lap pool with a temperature around 84 There may be other therapists and patients in the pool at the same time.   Contact Info:            To cancel appointment, please call [place] at [phone] If you are running late, please call [facility] Location and contact info description of services cost  [facility] [place], [place] [phone]  Aquatics, silver sneakers classes, yoga, exercise equipment $18/month for 55+ $25/month individual $40-$60/month family  [facility] [address] [place], [place] [phone] Transportation to appointments. Activities (stretching, dancing, Tai Chi)  Mostly free  [place] [facility] [address] [place], [place] [phone]  [facility] Fee based  healthwise exercise program Various locations [phone]  [place] exercise free  [facility] Various locations [phone] [place], [place], , Table tennis, chair volleyball, [place]  free  [facility] [place]. [place], [place]  [phone] Various activities (drum exercise, yoga, line dancing, etc)  free

## 2022-07-29 ENCOUNTER — Encounter: Payer: Self-pay | Admitting: Family Medicine

## 2022-07-31 ENCOUNTER — Encounter: Payer: Self-pay | Admitting: Family Medicine

## 2022-07-31 ENCOUNTER — Ambulatory Visit: Payer: Medicare Other | Admitting: Rehabilitative and Restorative Service Providers"

## 2022-07-31 DIAGNOSIS — J9600 Acute respiratory failure, unspecified whether with hypoxia or hypercapnia: Secondary | ICD-10-CM | POA: Diagnosis not present
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
## 2022-08-01 ENCOUNTER — Encounter: Payer: Self-pay | Admitting: Physical Therapy

## 2022-08-01 ENCOUNTER — Ambulatory Visit: Payer: Medicare Other | Admitting: Physical Therapy

## 2022-08-01 ENCOUNTER — Telehealth: Payer: Self-pay | Admitting: Family Medicine

## 2022-08-01 DIAGNOSIS — M542 Cervicalgia: Secondary | ICD-10-CM | POA: Diagnosis not present

## 2022-08-01 DIAGNOSIS — G8929 Other chronic pain: Secondary | ICD-10-CM | POA: Diagnosis not present

## 2022-08-01 DIAGNOSIS — M5441 Lumbago with sciatica, right side: Secondary | ICD-10-CM | POA: Diagnosis not present

## 2022-08-01 DIAGNOSIS — R29898 Other symptoms and signs involving the musculoskeletal system: Secondary | ICD-10-CM

## 2022-08-01 DIAGNOSIS — M6281 Muscle weakness (generalized): Secondary | ICD-10-CM

## 2022-08-01 NOTE — Telephone Encounter (Unsigned)
Patient dropped off Disability paperwork 08/01/2022  to be completed will put in box for Dr. Zigmund Daniel to complete. DM

## 2022-08-01 NOTE — Telephone Encounter (Signed)
I called and left a message for a return call.  

## 2022-08-02 ENCOUNTER — Ambulatory Visit: Payer: Medicare Other | Admitting: Physical Therapy

## 2022-08-03 ENCOUNTER — Encounter: Payer: Self-pay | Admitting: Physical Therapy

## 2022-08-03 ENCOUNTER — Ambulatory Visit: Payer: Medicare Other | Admitting: Physical Therapy

## 2022-08-03 DIAGNOSIS — G8929 Other chronic pain: Secondary | ICD-10-CM

## 2022-08-03 DIAGNOSIS — M6281 Muscle weakness (generalized): Secondary | ICD-10-CM | POA: Diagnosis not present

## 2022-08-03 DIAGNOSIS — M542 Cervicalgia: Secondary | ICD-10-CM | POA: Diagnosis not present

## 2022-08-03 DIAGNOSIS — R29898 Other symptoms and signs involving the musculoskeletal system: Secondary | ICD-10-CM

## 2022-08-03 DIAGNOSIS — M5441 Lumbago with sciatica, right side: Secondary | ICD-10-CM | POA: Diagnosis not present

## 2022-08-03 NOTE — Telephone Encounter (Signed)
I did not see the disability paperwork in my box.

## 2022-08-03 NOTE — Telephone Encounter (Signed)
Spoke to [facility] PM and was told that the pharmacist there states patient picked up his pain medication prescription on 11///2023 but the patient states he did not pick up the prescription. She is going to check into this further and will  call back to let us know of the outcome.
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
## 2022-08-04 ENCOUNTER — Encounter: Payer: Self-pay | Admitting: Sports Medicine

## 2022-08-04 ENCOUNTER — Ambulatory Visit (INDEPENDENT_AMBULATORY_CARE_PROVIDER_SITE_OTHER): Payer: Medicare Other | Admitting: Sports Medicine

## 2022-08-04 DIAGNOSIS — G8929 Other chronic pain: Secondary | ICD-10-CM | POA: Diagnosis not present

## 2022-08-04 DIAGNOSIS — M542 Cervicalgia: Secondary | ICD-10-CM

## 2022-08-04 NOTE — Telephone Encounter (Signed)
Please contact the patient to advise documentation is completed.  Fee due. Please make copy for CMA and send copy to scan.   Thanks

## 2022-08-04 NOTE — Progress Notes (Signed)
    Procedures performed today:    None.  Independent interpretation of notes and tests performed by another provider:   None.  Brief History, Exam, Impression, and Recommendations:    Chronic neck pain This is a very pleasant 46 year old male, he has a history of lumbar spondylosis, he also has had a cervical spine x-ray back in 2011 showing mild uncovertebral degenerative changes and facet arthritis. Unfortunately he has continued to have pain, right side of the neck, worse with turning his neck left and right. He understands that the initial treatment will be conservative, adding x-rays, I would like formal physical therapy to work on his neck as well. I will email him some cervical spondylosis conditioning exercises to get started with. Return to see me in 6 weeks, MR for interventional planning if no better.    ____________________________________________ [name]. Benjamin Stain, M.D., ABFM., CAQSM., AME. Primary Care and Sports Medicine [facility]  Adjunct Professor of Family Medicine  [employer]  Restaurant manager, fast food

## 2022-08-04 NOTE — Telephone Encounter (Signed)
Called patient and notified of paperwork completion and fee. Made copy for scan and made copy for CMA, placed in CMAs box. Bobby Shaw

## 2022-08-04 NOTE — Assessment & Plan Note (Signed)
This is a very pleasant 46 year old male, he has a history of lumbar spondylosis, he also has had a cervical spine x-ray back in 2011 showing mild uncovertebral degenerative changes and facet arthritis. Unfortunately he has continued to have pain, right side of the neck, worse with turning his neck left and right. He understands that the initial treatment will be conservative, adding x-rays, I would like formal physical therapy to work on his neck as well. I will email him some cervical spondylosis conditioning exercises to get started with. Return to see me in 6 weeks, MR for interventional planning if no better.

## 2022-08-07 ENCOUNTER — Ambulatory Visit: Payer: Medicare Other | Admitting: Rehabilitative and Restorative Service Providers"

## 2022-08-08 ENCOUNTER — Ambulatory Visit: Payer: Medicare Other | Admitting: Rehabilitative and Restorative Service Providers"

## 2022-08-08 ENCOUNTER — Ambulatory Visit (INDEPENDENT_AMBULATORY_CARE_PROVIDER_SITE_OTHER): Payer: Medicare Other

## 2022-08-08 ENCOUNTER — Encounter: Payer: Self-pay | Admitting: Rehabilitative and Restorative Service Providers"

## 2022-08-08 DIAGNOSIS — M47812 Spondylosis without myelopathy or radiculopathy, cervical region: Secondary | ICD-10-CM | POA: Diagnosis not present

## 2022-08-08 DIAGNOSIS — M542 Cervicalgia: Secondary | ICD-10-CM | POA: Diagnosis not present

## 2022-08-08 DIAGNOSIS — R29898 Other symptoms and signs involving the musculoskeletal system: Secondary | ICD-10-CM

## 2022-08-08 DIAGNOSIS — G8929 Other chronic pain: Secondary | ICD-10-CM

## 2022-08-08 DIAGNOSIS — M5441 Lumbago with sciatica, right side: Secondary | ICD-10-CM | POA: Diagnosis not present

## 2022-08-08 DIAGNOSIS — M6281 Muscle weakness (generalized): Secondary | ICD-10-CM
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
## 2022-08-09 ENCOUNTER — Ambulatory Visit: Payer: Medicare Other | Admitting: Physical Therapy

## 2022-08-10 ENCOUNTER — Encounter: Payer: Self-pay | Admitting: Physical Therapy

## 2022-08-10 ENCOUNTER — Ambulatory Visit: Payer: Medicare Other | Admitting: Physical Therapy

## 2022-08-10 ENCOUNTER — Other Ambulatory Visit: Payer: Self-pay | Admitting: Family Medicine

## 2022-08-10 DIAGNOSIS — E119 Type 2 diabetes mellitus without complications: Secondary | ICD-10-CM

## 2022-08-10 DIAGNOSIS — M6281 Muscle weakness (generalized): Secondary | ICD-10-CM

## 2022-08-10 DIAGNOSIS — M542 Cervicalgia: Secondary | ICD-10-CM | POA: Diagnosis not present

## 2022-08-10 DIAGNOSIS — G8929 Other chronic pain: Secondary | ICD-10-CM | POA: Diagnosis not present

## 2022-08-10 DIAGNOSIS — M5441 Lumbago with sciatica, right side: Secondary | ICD-10-CM | POA: Diagnosis not present

## 2022-08-10 DIAGNOSIS — R29898 Other symptoms and signs involving the musculoskeletal system: Secondary | ICD-10-CM | POA: Diagnosis not present
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
## 2022-08-14 ENCOUNTER — Encounter: Payer: Self-pay | Admitting: Family Medicine

## 2022-08-14 ENCOUNTER — Ambulatory Visit: Payer: Medicare Other | Admitting: Rehabilitative and Restorative Service Providers"
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
## 2022-08-16 ENCOUNTER — Encounter: Payer: Self-pay | Admitting: Physical Therapy

## 2022-08-16 ENCOUNTER — Ambulatory Visit: Payer: Medicare Other | Admitting: Physical Therapy

## 2022-08-16 ENCOUNTER — Other Ambulatory Visit: Payer: Self-pay

## 2022-08-16 DIAGNOSIS — R29898 Other symptoms and signs involving the musculoskeletal system: Secondary | ICD-10-CM | POA: Diagnosis not present

## 2022-08-16 DIAGNOSIS — M542 Cervicalgia: Secondary | ICD-10-CM | POA: Diagnosis not present

## 2022-08-16 DIAGNOSIS — M6281 Muscle weakness (generalized): Secondary | ICD-10-CM | POA: Diagnosis not present

## 2022-08-16 DIAGNOSIS — G8929 Other chronic pain: Secondary | ICD-10-CM | POA: Diagnosis not present

## 2022-08-16 DIAGNOSIS — M5441 Lumbago with sciatica, right side: Secondary | ICD-10-CM | POA: Diagnosis not present

## 2022-08-21 ENCOUNTER — Ambulatory Visit
Admission: EM | Admit: 2022-08-21 | Discharge: 2022-08-21 | Disposition: A | Discharge status: Home / Self Care | Payer: Medicare Other | Attending: Family Medicine | Admitting: Family Medicine

## 2022-08-21 ENCOUNTER — Ambulatory Visit: Payer: Medicare Other | Admitting: Rehabilitative and Restorative Service Providers"

## 2022-08-21 ENCOUNTER — Encounter: Payer: Self-pay | Admitting: Emergency Medicine

## 2022-08-21 ENCOUNTER — Encounter: Payer: Self-pay | Admitting: Rehabilitative and Restorative Service Providers"

## 2022-08-21 ENCOUNTER — Encounter: Payer: Self-pay | Admitting: Sports Medicine

## 2022-08-21 ENCOUNTER — Ambulatory Visit (INDEPENDENT_AMBULATORY_CARE_PROVIDER_SITE_OTHER): Payer: Medicare Other

## 2022-08-21 DIAGNOSIS — G8929 Other chronic pain: Secondary | ICD-10-CM

## 2022-08-21 DIAGNOSIS — R29898 Other symptoms and signs involving the musculoskeletal system: Secondary | ICD-10-CM | POA: Diagnosis not present

## 2022-08-21 DIAGNOSIS — M5441 Lumbago with sciatica, right side: Secondary | ICD-10-CM | POA: Diagnosis not present

## 2022-08-21 DIAGNOSIS — M542 Cervicalgia: Secondary | ICD-10-CM | POA: Diagnosis not present

## 2022-08-21 DIAGNOSIS — M6281 Muscle weakness (generalized): Secondary | ICD-10-CM | POA: Diagnosis not present

## 2022-08-21 DIAGNOSIS — M79672 Pain in left foot: Secondary | ICD-10-CM
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
## 2022-08-21 NOTE — Discharge Instructions (Addendum)
Advised patient of left foot x-ray results with hard copy provided to patient.  Advised patient to RICE affected area of left foot for 25 to 30 minutes 3 times daily for the next 3 days.  Advised if symptoms worsen and/or unresolved please follow-up with [facility] orthopedic provider for further evaluation contact information is below.

## 2022-08-21 NOTE — ED Triage Notes (Signed)
Patient states that his dog jumped on his left foot x 5 days ago.  The foot is somewhat swollen, painful and some bruising.  Patient has not taken any OTC pain meds due to be on Suboxone.
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
## 2022-08-22 DIAGNOSIS — Z79891 Long term (current) use of opiate analgesic: Secondary | ICD-10-CM | POA: Diagnosis not present

## 2022-08-22 DIAGNOSIS — M5412 Radiculopathy, cervical region: Secondary | ICD-10-CM | POA: Diagnosis not present

## 2022-08-22 DIAGNOSIS — M545 Low back pain, unspecified: Secondary | ICD-10-CM | POA: Diagnosis not present

## 2022-08-22 DIAGNOSIS — M25572 Pain in left ankle and joints of left foot: Secondary | ICD-10-CM | POA: Diagnosis not present

## 2022-08-22 DIAGNOSIS — M25512 Pain in left shoulder: Secondary | ICD-10-CM | POA: Diagnosis not present

## 2022-08-22 DIAGNOSIS — K5903 Drug induced constipation: Secondary | ICD-10-CM | POA: Diagnosis not present

## 2022-08-22 DIAGNOSIS — G5790 Unspecified mononeuropathy of unspecified lower limb: Secondary | ICD-10-CM | POA: Diagnosis not present

## 2022-08-22 DIAGNOSIS — M25571 Pain in right ankle and joints of right foot: Secondary | ICD-10-CM | POA: Diagnosis not present

## 2022-08-22 DIAGNOSIS — G894 Chronic pain syndrome: Secondary | ICD-10-CM | POA: Diagnosis not present

## 2022-08-22 DIAGNOSIS — M25511 Pain in right shoulder: Secondary | ICD-10-CM | POA: Diagnosis not present

## 2022-08-22 DIAGNOSIS — M542 Cervicalgia: Secondary | ICD-10-CM | POA: Diagnosis not present

## 2022-08-22 DIAGNOSIS — M5137 Other intervertebral disc degeneration, lumbosacral region: Secondary | ICD-10-CM | POA: Diagnosis not present

## 2022-08-23 ENCOUNTER — Encounter: Payer: Self-pay | Admitting: Physical Therapy

## 2022-08-23 ENCOUNTER — Ambulatory Visit: Payer: Medicare Other | Admitting: Physical Therapy

## 2022-08-23 ENCOUNTER — Telehealth: Payer: Self-pay

## 2022-08-23 ENCOUNTER — Ambulatory Visit (INDEPENDENT_AMBULATORY_CARE_PROVIDER_SITE_OTHER): Payer: Medicare Other | Admitting: Family Medicine

## 2022-08-23 ENCOUNTER — Encounter: Payer: Self-pay | Admitting: Family Medicine

## 2022-08-23 VITALS — BP 109/60 | HR 84 | Ht 69.0 in | Wt 201.0 lb

## 2022-08-23 DIAGNOSIS — M6281 Muscle weakness (generalized): Secondary | ICD-10-CM | POA: Diagnosis not present

## 2022-08-23 DIAGNOSIS — R29898 Other symptoms and signs involving the musculoskeletal system: Secondary | ICD-10-CM | POA: Diagnosis not present

## 2022-08-23 DIAGNOSIS — Z1211 Encounter for screening for malignant neoplasm of colon: Secondary | ICD-10-CM | POA: Diagnosis not present

## 2022-08-23 DIAGNOSIS — F3131 Bipolar disorder, current episode depressed, mild: Secondary | ICD-10-CM

## 2022-08-23 DIAGNOSIS — F1910 Other psychoactive substance abuse, uncomplicated: Secondary | ICD-10-CM | POA: Diagnosis not present

## 2022-08-23 DIAGNOSIS — G8929 Other chronic pain: Secondary | ICD-10-CM | POA: Diagnosis not present

## 2022-08-23 DIAGNOSIS — E119 Type 2 diabetes mellitus without complications: Secondary | ICD-10-CM | POA: Diagnosis not present

## 2022-08-23 DIAGNOSIS — I10 Essential (primary) hypertension: Secondary | ICD-10-CM

## 2022-08-23 DIAGNOSIS — M542 Cervicalgia: Secondary | ICD-10-CM | POA: Diagnosis not present

## 2022-08-23 DIAGNOSIS — M792 Neuralgia and neuritis, unspecified: Secondary | ICD-10-CM | POA: Diagnosis not present

## 2022-08-23 DIAGNOSIS — M5441 Lumbago with sciatica, right side: Secondary | ICD-10-CM | POA: Diagnosis not present

## 2022-08-23 LAB — POCT GLYCOSYLATED HEMOGLOBIN (HGB A1C): HbA1c, POC (prediabetic range): 5.9 % (ref 5.7–6.4)

## 2022-08-23 MED ORDER — ATORVASTATIN CALCIUM 40 MG PO TABS: 40.0000 mg | ORAL_TABLET | Freq: Every day | ORAL | 2 refills | Status: DC

## 2022-08-23 MED ORDER — PREGABALIN 150 MG PO CAPS: 150.0000 mg | ORAL_CAPSULE | Freq: Two times a day (BID) | ORAL | 1 refills | Status: DC

## 2022-08-23 NOTE — Patient Instructions (Addendum)
Let's try lyrica to replace gabapentin.  Continue with atorvastatin Take metformin regularly. See me again in 3 months.

## 2022-08-23 NOTE — Telephone Encounter (Signed)
Pharmacy advised  

## 2022-08-23 NOTE — Assessment & Plan Note (Signed)
Continue Cymbalta and Seroquel at current strength.

## 2022-08-23 NOTE — Assessment & Plan Note (Signed)
Blood pressure mains stable.  Continue losartan at current strength.
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
## 2022-08-23 NOTE — Assessment & Plan Note (Signed)
Continue Cymbalta at current strength.  Additionally will add Lyrica to replace gabapentin.  He has been on fairly high strength of gabapentin so we will start at 150 mg twice daily Lyrica.

## 2022-08-23 NOTE — Assessment & Plan Note (Signed)
He has not consumed alcohol or marijuana in the past several months.  Additionally, he continues with pain management for management of his Suboxone.
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
## 2022-08-23 NOTE — Assessment & Plan Note (Signed)
Diabetes remains fairly well controlled.  Slightly higher A1c done last check.  Encouraged to work on dietary changes.  Continue metformin at current strength.

## 2022-08-23 NOTE — Telephone Encounter (Signed)
The pharmacist from [facility] called and left a message. She is questioning why patient is starting on such a high dose of Lyrica. Also wanted Dr Ashley Royalty to know he is on suboxone. They are requesting a call back for the ok to fill.

## 2022-08-23 NOTE — Telephone Encounter (Signed)
He has been on 800mg  of Gabapentin TID.  This is similar to his dose of gabapentin which he is discontinuing.   Aware of suboxone.  Ok to fill.

## 2022-08-26 ENCOUNTER — Encounter: Payer: Self-pay | Admitting: Family Medicine

## 2022-08-28 ENCOUNTER — Encounter: Payer: Self-pay | Admitting: Sports Medicine

## 2022-08-30 ENCOUNTER — Encounter: Payer: Self-pay | Admitting: Physical Therapy

## 2022-08-30 ENCOUNTER — Ambulatory Visit: Payer: Medicare Other | Attending: Sports Medicine | Admitting: Physical Therapy

## 2022-08-30 DIAGNOSIS — J9600 Acute respiratory failure, unspecified whether with hypoxia or hypercapnia: Secondary | ICD-10-CM | POA: Diagnosis not present

## 2022-08-30 DIAGNOSIS — M6281 Muscle weakness (generalized): Secondary | ICD-10-CM | POA: Insufficient documentation

## 2022-08-30 DIAGNOSIS — G8929 Other chronic pain: Secondary | ICD-10-CM | POA: Diagnosis present

## 2022-08-30 DIAGNOSIS — M5441 Lumbago with sciatica, right side: Secondary | ICD-10-CM | POA: Insufficient documentation

## 2022-08-30 DIAGNOSIS — M542 Cervicalgia: Secondary | ICD-10-CM | POA: Diagnosis present

## 2022-08-30 DIAGNOSIS — R29898 Other symptoms and signs involving the musculoskeletal system: Secondary | ICD-10-CM | POA: Diagnosis present
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
## 2022-09-01 ENCOUNTER — Ambulatory Visit: Payer: Medicare Other

## 2022-09-01 DIAGNOSIS — G8929 Other chronic pain: Secondary | ICD-10-CM

## 2022-09-01 DIAGNOSIS — M542 Cervicalgia: Secondary | ICD-10-CM

## 2022-09-01 DIAGNOSIS — R29898 Other symptoms and signs involving the musculoskeletal system: Secondary | ICD-10-CM

## 2022-09-01 DIAGNOSIS — M6281 Muscle weakness (generalized): Secondary | ICD-10-CM
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
## 2022-09-03 ENCOUNTER — Encounter: Payer: Self-pay | Admitting: Family Medicine

## 2022-09-03 ENCOUNTER — Other Ambulatory Visit: Payer: Self-pay | Admitting: Family Medicine

## 2022-09-03 DIAGNOSIS — E119 Type 2 diabetes mellitus without complications: Secondary | ICD-10-CM

## 2022-09-04 ENCOUNTER — Ambulatory Visit: Payer: Medicare Other | Admitting: Physical Therapy

## 2022-09-04 ENCOUNTER — Encounter: Payer: Self-pay | Admitting: Physical Therapy

## 2022-09-04 ENCOUNTER — Other Ambulatory Visit: Payer: Self-pay

## 2022-09-04 ENCOUNTER — Telehealth: Payer: Self-pay | Admitting: Family Medicine

## 2022-09-04 DIAGNOSIS — M5441 Lumbago with sciatica, right side: Secondary | ICD-10-CM | POA: Diagnosis not present

## 2022-09-04 DIAGNOSIS — R29898 Other symptoms and signs involving the musculoskeletal system: Secondary | ICD-10-CM | POA: Diagnosis not present

## 2022-09-04 DIAGNOSIS — M6281 Muscle weakness (generalized): Secondary | ICD-10-CM

## 2022-09-04 DIAGNOSIS — G8929 Other chronic pain: Secondary | ICD-10-CM | POA: Diagnosis not present

## 2022-09-04 DIAGNOSIS — M542 Cervicalgia: Secondary | ICD-10-CM | POA: Diagnosis not present

## 2022-09-04 MED ORDER — ONETOUCH DELICA PLUS LANCING MISC: 3 refills | Status: AC

## 2022-09-04 NOTE — Telephone Encounter (Signed)
Rx signed.

## 2022-09-04 NOTE — Telephone Encounter (Signed)
Patient came in about forms that was filled out to be completed and was told that you completed them already but the other party stated they weren't received . The paperwork should be going to [employer] [facility] they need to be resent. The contact person is Judie Grieve at [phone]  please contact once sent as well if possible per patient. The fax number to where it should be sent would [phone].

## 2022-09-05 ENCOUNTER — Other Ambulatory Visit (HOSPITAL_COMMUNITY): Payer: Self-pay

## 2022-09-05 MED ORDER — ONETOUCH DELICA PLUS LANCET33G MISC
1 refills | Status: AC
  Filled 2022-09-05 – 2022-09-06 (×2): qty 100, 100d supply, fill #0
  Filled 2023-02-28: qty 100, 90d supply, fill #0

## 2022-09-06 ENCOUNTER — Other Ambulatory Visit (HOSPITAL_COMMUNITY): Payer: Self-pay

## 2022-09-06 ENCOUNTER — Ambulatory Visit: Payer: Medicare Other

## 2022-09-06 ENCOUNTER — Ambulatory Visit: Payer: Medicare Other | Admitting: Sports Medicine

## 2022-09-06 DIAGNOSIS — M6281 Muscle weakness (generalized): Secondary | ICD-10-CM

## 2022-09-06 DIAGNOSIS — M542 Cervicalgia: Secondary | ICD-10-CM

## 2022-09-06 DIAGNOSIS — R29898 Other symptoms and signs involving the musculoskeletal system: Secondary | ICD-10-CM

## 2022-09-06 DIAGNOSIS — G8929 Other chronic pain: Secondary | ICD-10-CM

## 2022-09-06 DIAGNOSIS — M5441 Lumbago with sciatica, right side: Secondary | ICD-10-CM | POA: Diagnosis not present
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
## 2022-09-08 NOTE — Telephone Encounter (Signed)
The documents have been re-faxed to [employer].

## 2022-09-11 ENCOUNTER — Ambulatory Visit: Payer: Medicare Other

## 2022-09-11 DIAGNOSIS — R29898 Other symptoms and signs involving the musculoskeletal system: Secondary | ICD-10-CM

## 2022-09-11 DIAGNOSIS — G8929 Other chronic pain: Secondary | ICD-10-CM

## 2022-09-11 DIAGNOSIS — M542 Cervicalgia: Secondary | ICD-10-CM

## 2022-09-11 DIAGNOSIS — M6281 Muscle weakness (generalized): Secondary | ICD-10-CM

## 2022-09-11 DIAGNOSIS — M5441 Lumbago with sciatica, right side: Secondary | ICD-10-CM | POA: Diagnosis not present
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
## 2022-09-14 ENCOUNTER — Ambulatory Visit: Payer: Medicare Other

## 2022-09-14 DIAGNOSIS — G8929 Other chronic pain: Secondary | ICD-10-CM | POA: Diagnosis not present

## 2022-09-14 DIAGNOSIS — M542 Cervicalgia: Secondary | ICD-10-CM | POA: Diagnosis not present

## 2022-09-14 DIAGNOSIS — M5441 Lumbago with sciatica, right side: Secondary | ICD-10-CM | POA: Diagnosis not present

## 2022-09-14 DIAGNOSIS — M6281 Muscle weakness (generalized): Secondary | ICD-10-CM | POA: Diagnosis not present

## 2022-09-14 DIAGNOSIS — R29898 Other symptoms and signs involving the musculoskeletal system: Secondary | ICD-10-CM
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
## 2022-09-20 ENCOUNTER — Ambulatory Visit: Payer: Medicare Other

## 2022-09-21 DIAGNOSIS — G894 Chronic pain syndrome: Secondary | ICD-10-CM | POA: Diagnosis not present

## 2022-09-21 DIAGNOSIS — Z79891 Long term (current) use of opiate analgesic: Secondary | ICD-10-CM | POA: Diagnosis not present

## 2022-09-27 ENCOUNTER — Ambulatory Visit: Payer: Medicare Other | Attending: Sports Medicine | Admitting: Physical Therapy

## 2022-09-27 ENCOUNTER — Encounter: Payer: Self-pay | Admitting: Physical Therapy

## 2022-09-27 ENCOUNTER — Other Ambulatory Visit: Payer: Self-pay | Admitting: Family Medicine

## 2022-09-27 DIAGNOSIS — M542 Cervicalgia: Secondary | ICD-10-CM | POA: Diagnosis present

## 2022-09-27 DIAGNOSIS — M6281 Muscle weakness (generalized): Secondary | ICD-10-CM | POA: Diagnosis present

## 2022-09-27 DIAGNOSIS — R29898 Other symptoms and signs involving the musculoskeletal system: Secondary | ICD-10-CM | POA: Insufficient documentation

## 2022-09-27 DIAGNOSIS — I10 Essential (primary) hypertension: Secondary | ICD-10-CM

## 2022-09-27 DIAGNOSIS — M5441 Lumbago with sciatica, right side: Secondary | ICD-10-CM | POA: Diagnosis present

## 2022-09-27 DIAGNOSIS — G8929 Other chronic pain: Secondary | ICD-10-CM | POA: Diagnosis present
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
## 2022-09-30 DIAGNOSIS — J9600 Acute respiratory failure, unspecified whether with hypoxia or hypercapnia: Secondary | ICD-10-CM | POA: Diagnosis not present

## 2022-10-05 ENCOUNTER — Encounter: Payer: Self-pay | Admitting: Family Medicine

## 2022-10-19 DIAGNOSIS — G894 Chronic pain syndrome: Secondary | ICD-10-CM | POA: Diagnosis not present

## 2022-10-19 DIAGNOSIS — M542 Cervicalgia: Secondary | ICD-10-CM | POA: Diagnosis not present

## 2022-10-19 DIAGNOSIS — M25512 Pain in left shoulder: Secondary | ICD-10-CM | POA: Diagnosis not present

## 2022-10-19 DIAGNOSIS — M5412 Radiculopathy, cervical region: Secondary | ICD-10-CM | POA: Diagnosis not present

## 2022-10-19 DIAGNOSIS — M5136 Other intervertebral disc degeneration, lumbar region: Secondary | ICD-10-CM | POA: Diagnosis not present

## 2022-10-19 DIAGNOSIS — K5903 Drug induced constipation: Secondary | ICD-10-CM | POA: Diagnosis not present

## 2022-10-19 DIAGNOSIS — Z79891 Long term (current) use of opiate analgesic: Secondary | ICD-10-CM | POA: Diagnosis not present

## 2022-10-19 DIAGNOSIS — M545 Low back pain, unspecified: Secondary | ICD-10-CM | POA: Diagnosis not present

## 2022-10-19 DIAGNOSIS — M5137 Other intervertebral disc degeneration, lumbosacral region: Secondary | ICD-10-CM | POA: Diagnosis not present

## 2022-10-19 DIAGNOSIS — M25571 Pain in right ankle and joints of right foot: Secondary | ICD-10-CM | POA: Diagnosis not present

## 2022-10-19 DIAGNOSIS — M25511 Pain in right shoulder: Secondary | ICD-10-CM | POA: Diagnosis not present

## 2022-10-19 DIAGNOSIS — M25572 Pain in left ankle and joints of left foot: Secondary | ICD-10-CM | POA: Diagnosis not present

## 2022-10-25 ENCOUNTER — Telehealth: Payer: Medicare Other | Admitting: Family Medicine

## 2022-10-25 IMAGING — MR MR FOOT*R* W/O CM
5 of 6 series · 31 of 40 positions shown · non-contrast
Comparison: Radiographs dated December 28, 2021

CLINICAL DATA: Foot pain, stress fracture suspected. Chronic pain
dorsal lateral midfoot.

EXAM:
MRI OF THE RIGHT FOOT WITHOUT CONTRAST
TECHNIQUE: Multiplanar, multisequence MR imaging of the right hindfoot/ankle
was performed. No intravenous contrast was administered.

[Series 3: PD fat-sat · axial · 3.0mm · 0.62mm/px · z∈[-22,+109]mm · 7 of 34 slices shown]
[im 1/34]
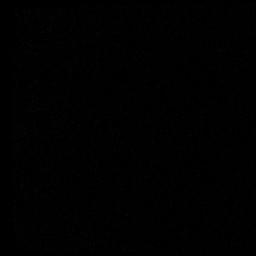
[im 6/34]
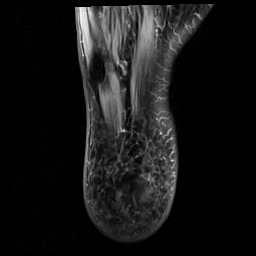
[im 12/34]
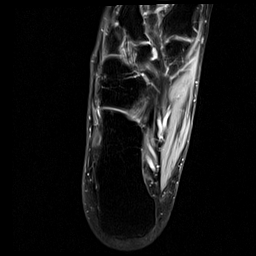
[im 17/34]
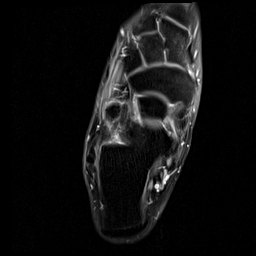
[im 23/34]
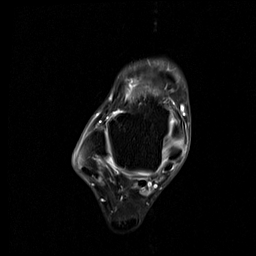
[im 28/34]
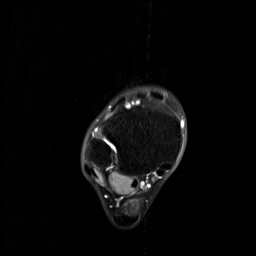
[im 34/34]
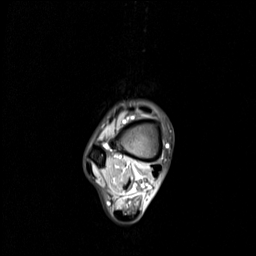

[Series 4: T2 fat-sat · axial · 3.0mm · 0.62mm/px · z∈[-19,+109]mm · 7 of 33 slices shown (1 of 2)]
[im 1/33]
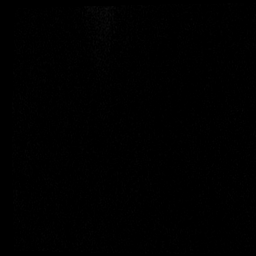
[im 6/33]
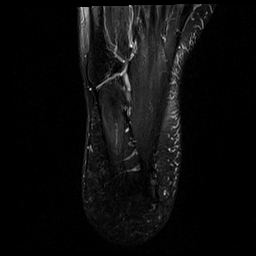
[im 11/33]
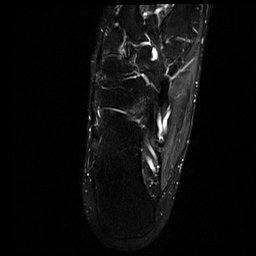
[im 17/33]
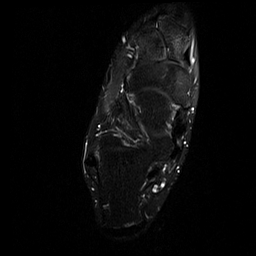
[im 22/33]
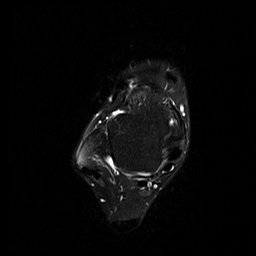
[im 27/33]
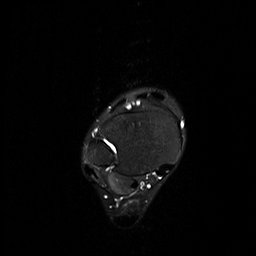
[im 33/33]
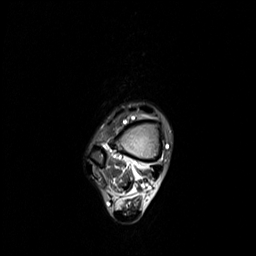

[Series 5: T2 fat-sat · coronal · 3.0mm · 0.31mm/px · 9 of 42 slices shown (2 of 2)]
[im 1/42]
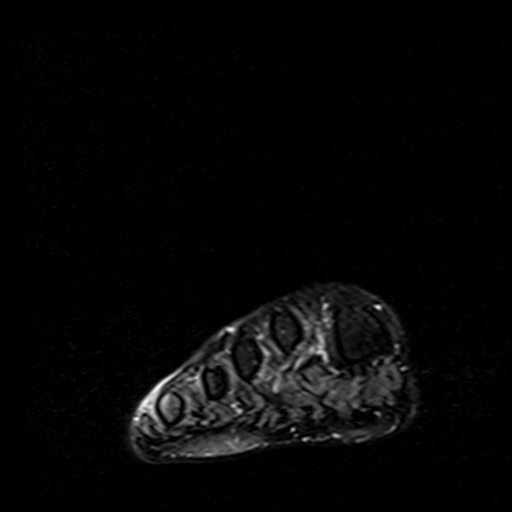
[im 6/42]
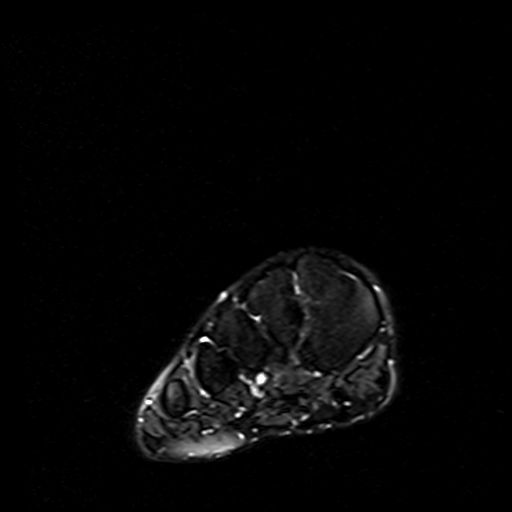
[im 11/42]
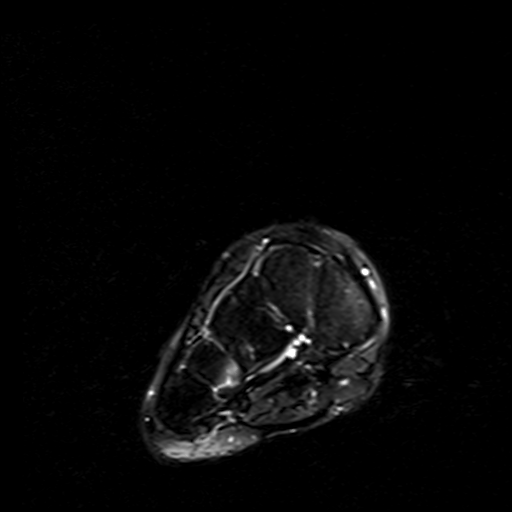
[im 16/42]
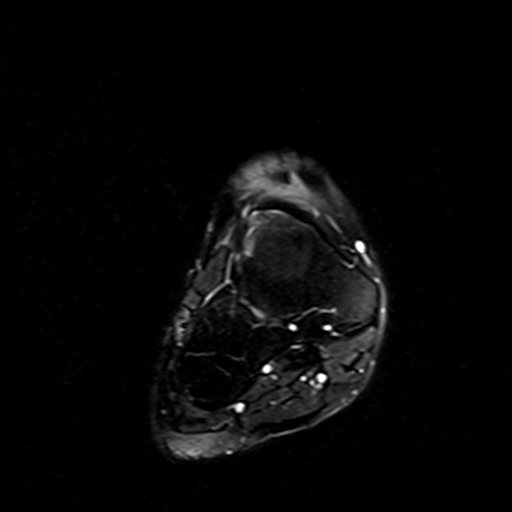
[im 21/42]
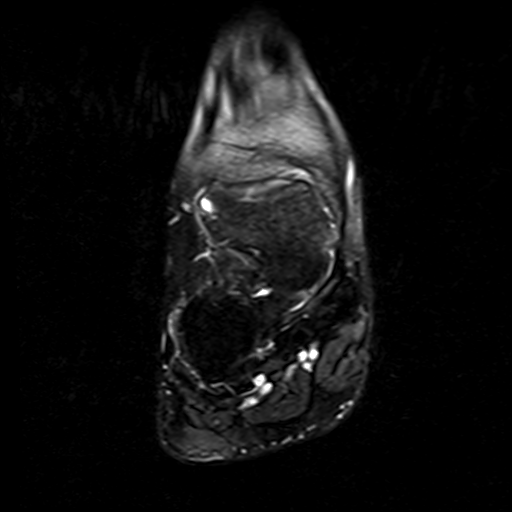
[im 26/42]
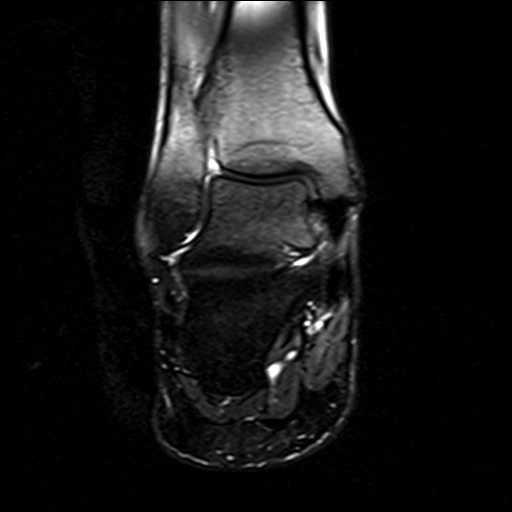
[im 31/42]
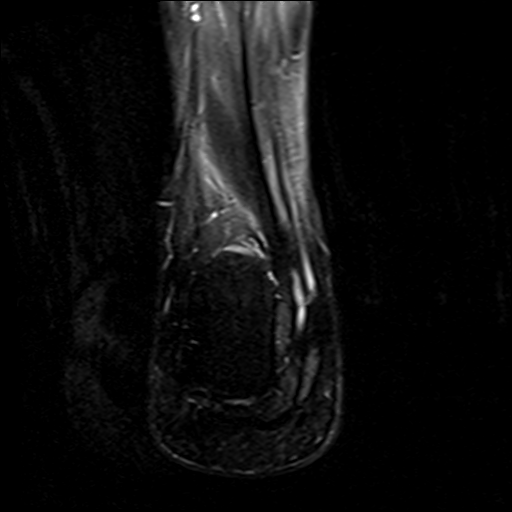
[im 36/42]
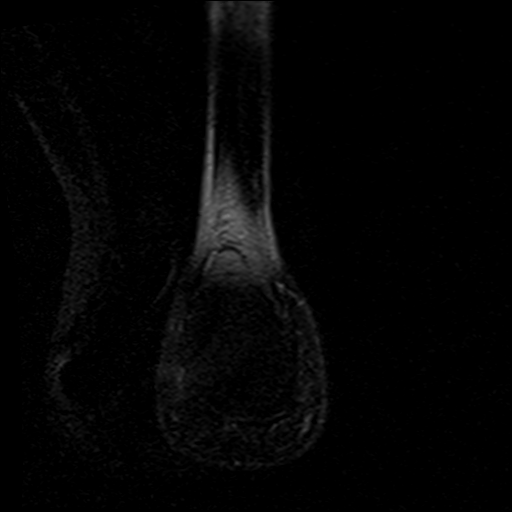
[im 42/42]
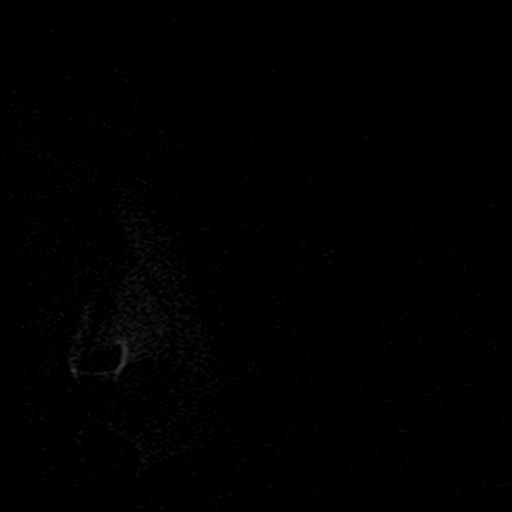

[Series 6: T1 · sagittal · 4.0mm · 0.35mm/px · 5 of 22 slices shown]
[im 1/22]
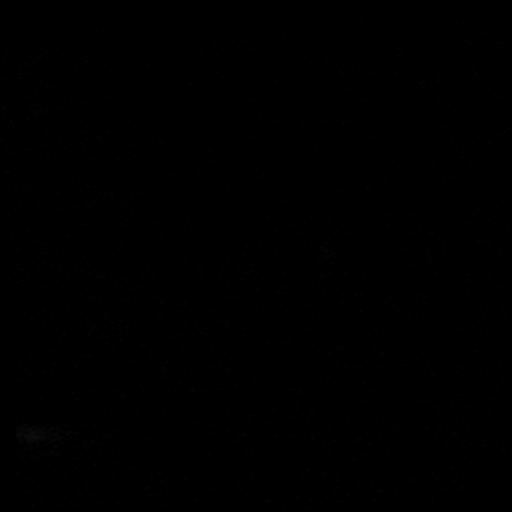
[im 6/22]
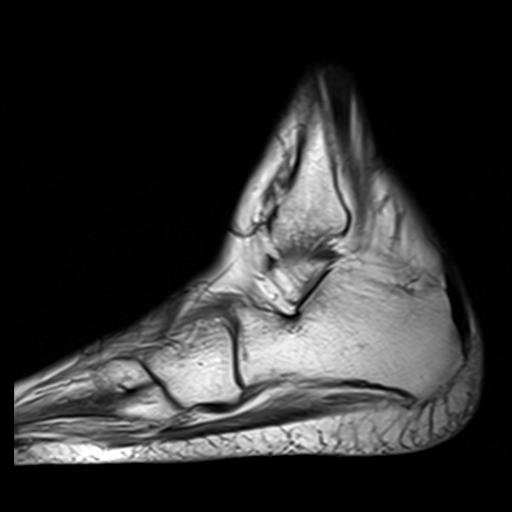
[im 11/22]
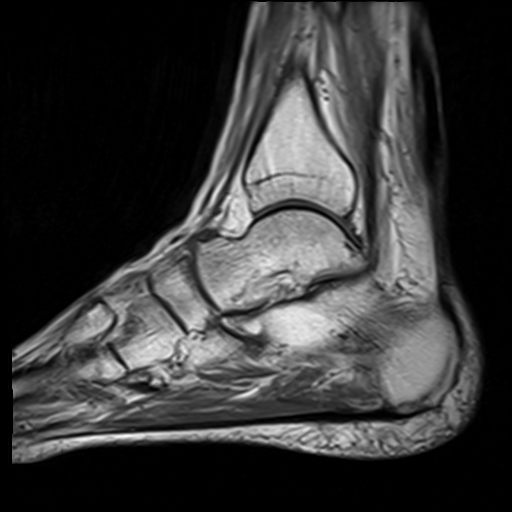
[im 16/22]
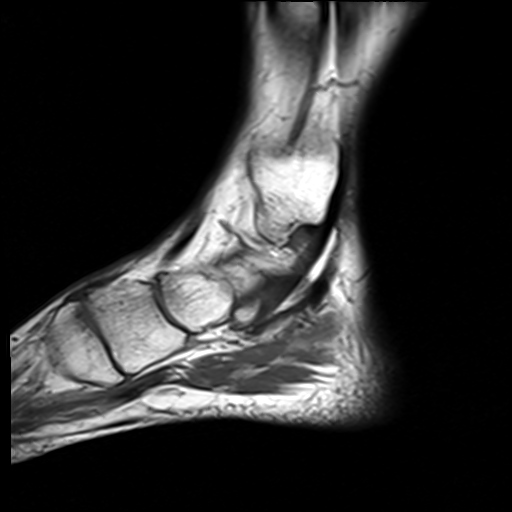
[im 22/22]
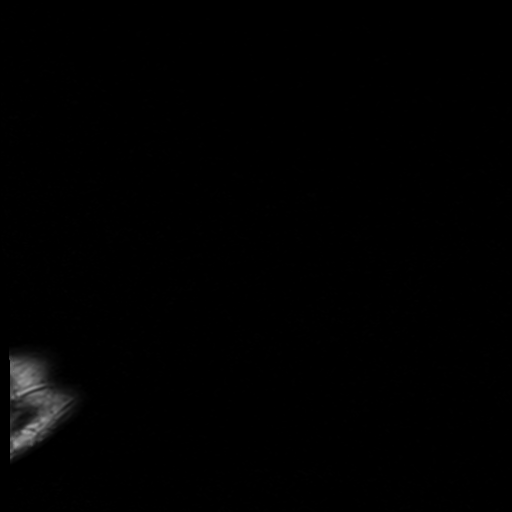

[Series 7: STIR · sagittal · 4.0mm · 0.70mm/px · 3 of 21 slices shown]
[im 1/21]
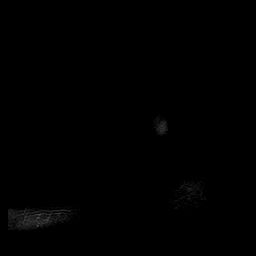
[im 7/21]
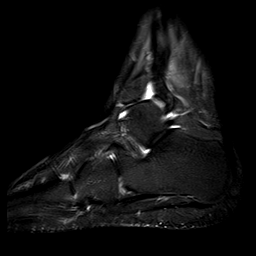
[im 14/21]
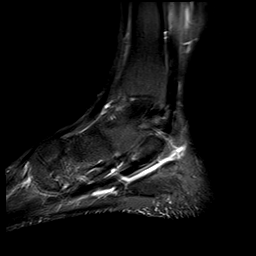

[31 of 40 positions shown; findings below may reference images not displayed]

FINDINGS: TENDONS

Peroneal: Peroneal longus tendon intact. Peroneal brevis intact.

Posteromedial: Posterior tibial tendon intact. Flexor hallucis
longus tendon intact. Flexor digitorum longus tendon intact.

Anterior: Tibialis anterior tendon intact. Extensor hallucis longus
tendon intact Extensor digitorum longus tendon intact.

Achilles:  Intact.

Plantar Fascia: Intact.

LIGAMENTS

Lateral: Anterior talofibular ligament intact. Calcaneofibular
ligament intact. Posterior talofibular ligament intact. Anterior and
posterior tibiofibular ligaments intact.

Medial: Deltoid ligament intact. Spring ligament intact.

CARTILAGE

Ankle Joint: No joint effusion. Normal ankle mortise. No chondral
defect.

Subtalar Joints/Sinus Tarsi: Normal subtalar joints. No subtalar
joint effusion. Normal sinus tarsi.

Bones: No marrow signal abnormality.  No fracture or dislocation.

Soft Tissue: No fluid collection or hematoma. Muscles are normal
without edema or atrophy. Tarsal tunnel is normal.
IMPRESSION: 1.  No evidence of fracture or dislocation.

2.  No evidence of ligamentous injury or tendon tear.

3.  Skin and subcutaneous soft tissues are within normal limits.

## 2022-10-26 DIAGNOSIS — J9600 Acute respiratory failure, unspecified whether with hypoxia or hypercapnia: Secondary | ICD-10-CM | POA: Diagnosis not present

## 2022-10-31 DIAGNOSIS — J9600 Acute respiratory failure, unspecified whether with hypoxia or hypercapnia: Secondary | ICD-10-CM | POA: Diagnosis not present

## 2022-11-06 ENCOUNTER — Other Ambulatory Visit: Payer: Self-pay | Admitting: Osteopathic Medicine

## 2022-11-15 ENCOUNTER — Other Ambulatory Visit: Payer: Self-pay

## 2022-11-15 ENCOUNTER — Encounter: Payer: Self-pay | Admitting: Family Medicine

## 2022-11-15 MED ORDER — ALLOPURINOL 100 MG PO TABS: ORAL_TABLET | ORAL | 0 refills | Status: DC

## 2022-11-16 DIAGNOSIS — K5903 Drug induced constipation: Secondary | ICD-10-CM | POA: Diagnosis not present

## 2022-11-16 DIAGNOSIS — M5136 Other intervertebral disc degeneration, lumbar region: Secondary | ICD-10-CM | POA: Diagnosis not present

## 2022-11-16 DIAGNOSIS — M25511 Pain in right shoulder: Secondary | ICD-10-CM | POA: Diagnosis not present

## 2022-11-16 DIAGNOSIS — M545 Low back pain, unspecified: Secondary | ICD-10-CM | POA: Diagnosis not present

## 2022-11-16 DIAGNOSIS — M542 Cervicalgia: Secondary | ICD-10-CM | POA: Diagnosis not present

## 2022-11-16 DIAGNOSIS — G894 Chronic pain syndrome: Secondary | ICD-10-CM | POA: Diagnosis not present

## 2022-11-16 DIAGNOSIS — M25512 Pain in left shoulder: Secondary | ICD-10-CM | POA: Diagnosis not present

## 2022-11-16 DIAGNOSIS — M25571 Pain in right ankle and joints of right foot: Secondary | ICD-10-CM | POA: Diagnosis not present

## 2022-11-16 DIAGNOSIS — M5137 Other intervertebral disc degeneration, lumbosacral region: Secondary | ICD-10-CM | POA: Diagnosis not present

## 2022-11-16 DIAGNOSIS — Z79891 Long term (current) use of opiate analgesic: Secondary | ICD-10-CM | POA: Diagnosis not present

## 2022-11-16 DIAGNOSIS — M5412 Radiculopathy, cervical region: Secondary | ICD-10-CM | POA: Diagnosis not present

## 2022-11-16 DIAGNOSIS — M25572 Pain in left ankle and joints of left foot: Secondary | ICD-10-CM | POA: Diagnosis not present

## 2022-11-29 DIAGNOSIS — J9600 Acute respiratory failure, unspecified whether with hypoxia or hypercapnia: Secondary | ICD-10-CM | POA: Diagnosis not present

## 2022-12-12 ENCOUNTER — Ambulatory Visit (INDEPENDENT_AMBULATORY_CARE_PROVIDER_SITE_OTHER): Payer: 59 | Admitting: Family Medicine

## 2022-12-12 ENCOUNTER — Encounter: Payer: Self-pay | Admitting: Family Medicine

## 2022-12-12 VITALS — BP 143/80 | HR 102 | Ht 69.0 in | Wt 213.0 lb

## 2022-12-12 DIAGNOSIS — Z1211 Encounter for screening for malignant neoplasm of colon: Secondary | ICD-10-CM

## 2022-12-12 DIAGNOSIS — I10 Essential (primary) hypertension: Secondary | ICD-10-CM

## 2022-12-12 DIAGNOSIS — M792 Neuralgia and neuritis, unspecified: Secondary | ICD-10-CM | POA: Diagnosis not present

## 2022-12-12 DIAGNOSIS — E782 Mixed hyperlipidemia: Secondary | ICD-10-CM | POA: Diagnosis not present

## 2022-12-12 DIAGNOSIS — E119 Type 2 diabetes mellitus without complications: Secondary | ICD-10-CM

## 2022-12-12 DIAGNOSIS — G8929 Other chronic pain: Secondary | ICD-10-CM | POA: Diagnosis not present

## 2022-12-12 DIAGNOSIS — E785 Hyperlipidemia, unspecified: Secondary | ICD-10-CM | POA: Diagnosis not present

## 2022-12-12 DIAGNOSIS — F1910 Other psychoactive substance abuse, uncomplicated: Secondary | ICD-10-CM

## 2022-12-12 DIAGNOSIS — F3131 Bipolar disorder, current episode depressed, mild: Secondary | ICD-10-CM

## 2022-12-12 MED ORDER — PREGABALIN 150 MG PO CAPS: 150.0000 mg | ORAL_CAPSULE | Freq: Two times a day (BID) | ORAL | 1 refills | Status: DC

## 2022-12-12 NOTE — Assessment & Plan Note (Signed)
Continue Seroquel at current strength. 

## 2022-12-12 NOTE — Assessment & Plan Note (Signed)
Blood pressure is mildly elevated today.  Encouraged low-sodium diet and smoking cessation.

## 2022-12-12 NOTE — Assessment & Plan Note (Signed)
Followed by [facility].  Currently on Suboxone treatment.
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
## 2022-12-12 NOTE — Assessment & Plan Note (Signed)
He has been managing with diet.  Updating A1c.

## 2022-12-12 NOTE — Assessment & Plan Note (Signed)
Continue Lyrica at current strength.

## 2022-12-12 NOTE — Patient Instructions (Addendum)
Have lipid panel and A1c completed when fasting.

## 2022-12-12 NOTE — Assessment & Plan Note (Signed)
He is now taking statin consistently Updating lipid panel.

## 2022-12-14 ENCOUNTER — Telehealth: Payer: Self-pay | Admitting: Family Medicine

## 2022-12-14 DIAGNOSIS — M542 Cervicalgia: Secondary | ICD-10-CM | POA: Diagnosis not present

## 2022-12-14 DIAGNOSIS — K5903 Drug induced constipation: Secondary | ICD-10-CM | POA: Diagnosis not present

## 2022-12-14 DIAGNOSIS — M25572 Pain in left ankle and joints of left foot: Secondary | ICD-10-CM | POA: Diagnosis not present

## 2022-12-14 DIAGNOSIS — M25511 Pain in right shoulder: Secondary | ICD-10-CM | POA: Diagnosis not present

## 2022-12-14 DIAGNOSIS — M25512 Pain in left shoulder: Secondary | ICD-10-CM | POA: Diagnosis not present

## 2022-12-14 DIAGNOSIS — M545 Low back pain, unspecified: Secondary | ICD-10-CM | POA: Diagnosis not present

## 2022-12-14 DIAGNOSIS — M25571 Pain in right ankle and joints of right foot: Secondary | ICD-10-CM | POA: Diagnosis not present

## 2022-12-14 DIAGNOSIS — M5412 Radiculopathy, cervical region: Secondary | ICD-10-CM | POA: Diagnosis not present

## 2022-12-14 DIAGNOSIS — Z79891 Long term (current) use of opiate analgesic: Secondary | ICD-10-CM | POA: Diagnosis not present

## 2022-12-14 DIAGNOSIS — M5137 Other intervertebral disc degeneration, lumbosacral region: Secondary | ICD-10-CM | POA: Diagnosis not present

## 2022-12-14 DIAGNOSIS — G894 Chronic pain syndrome: Secondary | ICD-10-CM | POA: Diagnosis not present

## 2022-12-14 DIAGNOSIS — M5136 Other intervertebral disc degeneration, lumbar region: Secondary | ICD-10-CM | POA: Diagnosis not present

## 2022-12-14 NOTE — Telephone Encounter (Signed)
Called patient to schedule Medicare Annual Wellness Visit (AWV). Unable to reach patient.  Last date of AWV: Never  Please schedule an appointment at any time with [facility].  If any questions, please contact me at [phone].  Thank you ,  Lin Givens Patient Access Advocate II Direct Dial: [phone]

## 2022-12-30 DIAGNOSIS — J9600 Acute respiratory failure, unspecified whether with hypoxia or hypercapnia: Secondary | ICD-10-CM | POA: Diagnosis not present

## 2023-01-05 ENCOUNTER — Other Ambulatory Visit: Payer: Self-pay | Admitting: Family Medicine

## 2023-01-05 DIAGNOSIS — Z8659 Personal history of other mental and behavioral disorders: Secondary | ICD-10-CM

## 2023-01-05 DIAGNOSIS — G4709 Other insomnia: Secondary | ICD-10-CM

## 2023-01-15 DIAGNOSIS — G894 Chronic pain syndrome: Secondary | ICD-10-CM | POA: Diagnosis not present

## 2023-01-15 DIAGNOSIS — M545 Low back pain, unspecified: Secondary | ICD-10-CM | POA: Diagnosis not present

## 2023-01-15 DIAGNOSIS — M25572 Pain in left ankle and joints of left foot: Secondary | ICD-10-CM | POA: Diagnosis not present

## 2023-01-15 DIAGNOSIS — M5412 Radiculopathy, cervical region: Secondary | ICD-10-CM | POA: Diagnosis not present

## 2023-01-15 DIAGNOSIS — M5136 Other intervertebral disc degeneration, lumbar region: Secondary | ICD-10-CM | POA: Diagnosis not present

## 2023-01-15 DIAGNOSIS — M25512 Pain in left shoulder: Secondary | ICD-10-CM | POA: Diagnosis not present

## 2023-01-15 DIAGNOSIS — M5137 Other intervertebral disc degeneration, lumbosacral region: Secondary | ICD-10-CM | POA: Diagnosis not present

## 2023-01-15 DIAGNOSIS — M542 Cervicalgia: Secondary | ICD-10-CM | POA: Diagnosis not present

## 2023-01-15 DIAGNOSIS — M25511 Pain in right shoulder: Secondary | ICD-10-CM | POA: Diagnosis not present

## 2023-01-15 DIAGNOSIS — M25571 Pain in right ankle and joints of right foot: Secondary | ICD-10-CM | POA: Diagnosis not present

## 2023-01-15 DIAGNOSIS — Z79891 Long term (current) use of opiate analgesic: Secondary | ICD-10-CM | POA: Diagnosis not present

## 2023-01-15 DIAGNOSIS — K5903 Drug induced constipation: Secondary | ICD-10-CM | POA: Diagnosis not present

## 2023-01-29 DIAGNOSIS — J9600 Acute respiratory failure, unspecified whether with hypoxia or hypercapnia: Secondary | ICD-10-CM | POA: Diagnosis not present

## 2023-02-12 DIAGNOSIS — G894 Chronic pain syndrome: Secondary | ICD-10-CM | POA: Diagnosis not present

## 2023-02-12 DIAGNOSIS — M25571 Pain in right ankle and joints of right foot: Secondary | ICD-10-CM | POA: Diagnosis not present

## 2023-02-12 DIAGNOSIS — M5412 Radiculopathy, cervical region: Secondary | ICD-10-CM | POA: Diagnosis not present

## 2023-02-12 DIAGNOSIS — M5137 Other intervertebral disc degeneration, lumbosacral region: Secondary | ICD-10-CM | POA: Diagnosis not present

## 2023-02-12 DIAGNOSIS — M545 Low back pain, unspecified: Secondary | ICD-10-CM | POA: Diagnosis not present

## 2023-02-12 DIAGNOSIS — M25572 Pain in left ankle and joints of left foot: Secondary | ICD-10-CM | POA: Diagnosis not present

## 2023-02-12 DIAGNOSIS — Z79891 Long term (current) use of opiate analgesic: Secondary | ICD-10-CM | POA: Diagnosis not present

## 2023-02-12 DIAGNOSIS — M25512 Pain in left shoulder: Secondary | ICD-10-CM | POA: Diagnosis not present

## 2023-02-12 DIAGNOSIS — G5790 Unspecified mononeuropathy of unspecified lower limb: Secondary | ICD-10-CM | POA: Diagnosis not present

## 2023-02-12 DIAGNOSIS — M25511 Pain in right shoulder: Secondary | ICD-10-CM | POA: Diagnosis not present

## 2023-02-12 DIAGNOSIS — M542 Cervicalgia: Secondary | ICD-10-CM | POA: Diagnosis not present

## 2023-02-12 DIAGNOSIS — K5903 Drug induced constipation: Secondary | ICD-10-CM | POA: Diagnosis not present

## 2023-02-15 ENCOUNTER — Other Ambulatory Visit: Payer: Self-pay | Admitting: Family Medicine

## 2023-02-15 DIAGNOSIS — E119 Type 2 diabetes mellitus without complications: Secondary | ICD-10-CM

## 2023-02-19 ENCOUNTER — Encounter: Payer: Self-pay | Admitting: Family Medicine

## 2023-02-19 ENCOUNTER — Encounter (INDEPENDENT_AMBULATORY_CARE_PROVIDER_SITE_OTHER): Payer: 59 | Admitting: Family Medicine

## 2023-02-19 DIAGNOSIS — F3131 Bipolar disorder, current episode depressed, mild: Secondary | ICD-10-CM | POA: Diagnosis not present

## 2023-02-20 ENCOUNTER — Encounter (INDEPENDENT_AMBULATORY_CARE_PROVIDER_SITE_OTHER): Payer: 59 | Admitting: Sports Medicine

## 2023-02-20 ENCOUNTER — Other Ambulatory Visit: Payer: Self-pay | Admitting: Family Medicine

## 2023-02-20 DIAGNOSIS — G5701 Lesion of sciatic nerve, right lower limb: Secondary | ICD-10-CM | POA: Diagnosis not present

## 2023-02-20 DIAGNOSIS — E781 Pure hyperglyceridemia: Secondary | ICD-10-CM

## 2023-02-20 DIAGNOSIS — E119 Type 2 diabetes mellitus without complications: Secondary | ICD-10-CM

## 2023-02-20 MED ORDER — DULOXETINE HCL 60 MG PO CPEP: 120.0000 mg | ORAL_CAPSULE | Freq: Every day | ORAL | 1 refills | Status: DC

## 2023-02-20 MED ORDER — AMITRIPTYLINE HCL 50 MG PO TABS
ORAL_TABLET | ORAL | 3 refills | Status: DC
Start: 2023-02-20 — End: 2023-06-19

## 2023-02-20 NOTE — Telephone Encounter (Signed)
I spent 5 total minutes of online digital evaluation and management services in this patient-initiated request for online care. 

## 2023-02-20 NOTE — Telephone Encounter (Signed)
Please see the MyChart message reply(ies) for my assessment and plan.    This patient gave consent for this Medical Advice Message and is aware that it may result in a bill to their insurance company, as well as the possibility of receiving a bill for a co-payment or deductible. They are an established patient, but are not seeking medical advice exclusively about a problem treated during an in person or video visit in the last seven days. I did not recommend an in person or video visit within seven days of my reply.    I spent a total of 6 minutes cumulative time within 7 days through MyChart messaging.  Willa Brocks, DO   

## 2023-02-22 MED ORDER — PREGABALIN 150 MG PO CAPS: 150.0000 mg | ORAL_CAPSULE | Freq: Two times a day (BID) | ORAL | 3 refills | Status: DC

## 2023-02-22 NOTE — Addendum Note (Signed)
Addended by: Monica Becton on: 02/22/2023 10:19 PM   Modules accepted: Orders

## 2023-02-23 MED ORDER — PREGABALIN 200 MG PO CAPS: 200.0000 mg | ORAL_CAPSULE | Freq: Three times a day (TID) | ORAL | 3 refills | Status: DC

## 2023-02-23 NOTE — Addendum Note (Signed)
Addended by: Monica Becton on: 02/23/2023 05:47 PM   Modules accepted: Orders

## 2023-02-26 ENCOUNTER — Ambulatory Visit (INDEPENDENT_AMBULATORY_CARE_PROVIDER_SITE_OTHER): Payer: 59 | Admitting: Sports Medicine

## 2023-02-26 DIAGNOSIS — M48062 Spinal stenosis, lumbar region with neurogenic claudication: Secondary | ICD-10-CM | POA: Diagnosis not present

## 2023-02-26 NOTE — Progress Notes (Signed)
    Procedures performed today:    None.  Independent interpretation of notes and tests performed by another provider:   I did personally review the lumbar spine MRI, I do see multilevel lumbar spondylosis with severe spinal stenosis at the L4-L5 level.  Brief History, Exam, Impression, and Recommendations:    Lumbar spinal stenosis This is a very pleasant 47 year old male, he has a history of multilevel lumbar spinal stenosis worse at L4-L5. Last MRI was approximately 5 years ago. He has had some epidurals in the past, predominantly L5-S1 and selective S1. We have been treating him from a medical standpoint, he gets Suboxone from his pain management provider, he is on high-dose Lyrica. He does have a history of sciatic neuropathy status post compartment syndrome. I think he is maximally medically managed at this point, his pain management doctor is not going to do any short acting opiates. [place] is reasonable to try to address his lumbar spine again as he does have pain in the right buttock with radiation down the right leg to the foot. Proceeding with a right L4-L5 interlaminar epidural. Return to see me 4 to 6 weeks after epidural for decision regarding epidural #2 or #3. He does understand that it is possible [place] imaging may request an updated lumbar spine MRI.  I spent 30 minutes of total time managing this patient today, this includes chart review, face to face, and non-face to face time.  ____________________________________________ [name]. Benjamin Stain, M.D., ABFM., CAQSM., AME. Primary Care and Sports Medicine [facility]  Adjunct Professor of Family Medicine  [place] [employer]  Restaurant manager, fast food

## 2023-02-26 NOTE — Assessment & Plan Note (Signed)
This is a very pleasant 46 year old male, he has a history of multilevel lumbar spinal stenosis worse at L4-L5. Last MRI was approximately 5 years ago. He has had some epidurals in the past, predominantly L5-S1 and selective S1. We have been treating him from a medical standpoint, he gets Suboxone from his pain management provider, he is on high-dose Lyrica. He does have a history of sciatic neuropathy status post compartment syndrome. I think he is maximally medically managed at this point, his pain management doctor is not going to do any short acting opiates. [place] is reasonable to try to address his lumbar spine again as he does have pain in the right buttock with radiation down the right leg to the foot. Proceeding with a right L4-L5 interlaminar epidural. Return to see me 4 to 6 weeks after epidural for decision regarding epidural #2 or #3. He does understand that it is possible [place] imaging may request an updated lumbar spine MRI.

## 2023-02-27 ENCOUNTER — Other Ambulatory Visit: Payer: Self-pay

## 2023-02-27 ENCOUNTER — Encounter: Payer: Self-pay | Admitting: Sports Medicine

## 2023-02-27 DIAGNOSIS — E119 Type 2 diabetes mellitus without complications: Secondary | ICD-10-CM

## 2023-02-27 DIAGNOSIS — M48062 Spinal stenosis, lumbar region with neurogenic claudication: Secondary | ICD-10-CM

## 2023-02-27 MED ORDER — ONETOUCH VERIO VI STRP
1.0000 | ORAL_STRIP | Freq: Three times a day (TID) | 3 refills | Status: AC
Start: 2023-02-27 — End: ?

## 2023-03-01 ENCOUNTER — Ambulatory Visit
Admission: RE | Admit: 2023-03-01 | Discharge: 2023-03-01 | Disposition: A | Discharge status: Home / Self Care | Payer: 59 | Source: Ambulatory Visit | Attending: Sports Medicine | Admitting: Sports Medicine

## 2023-03-01 ENCOUNTER — Other Ambulatory Visit (HOSPITAL_COMMUNITY): Payer: Self-pay

## 2023-03-01 DIAGNOSIS — J9600 Acute respiratory failure, unspecified whether with hypoxia or hypercapnia: Secondary | ICD-10-CM | POA: Diagnosis not present

## 2023-03-01 DIAGNOSIS — M48062 Spinal stenosis, lumbar region with neurogenic claudication: Secondary | ICD-10-CM

## 2023-03-01 DIAGNOSIS — M5416 Radiculopathy, lumbar region: Secondary | ICD-10-CM | POA: Diagnosis not present

## 2023-03-01 MED ORDER — IOPAMIDOL (ISOVUE-M 200) INJECTION 41%
1.0000 mL | Freq: Once | INTRAMUSCULAR | Status: AC
  Administered 2023-03-01: 1 mL via EPIDURAL

## 2023-03-01 MED ORDER — METHYLPREDNISOLONE ACETATE 40 MG/ML INJ SUSP (RADIOLOG
80.0000 mg | Freq: Once | INTRAMUSCULAR | Status: AC
  Administered 2023-03-01: 80 mg via EPIDURAL

## 2023-03-01 NOTE — Discharge Instructions (Signed)

## 2023-03-09 ENCOUNTER — Other Ambulatory Visit (HOSPITAL_COMMUNITY): Payer: Self-pay

## 2023-03-12 DIAGNOSIS — M545 Low back pain, unspecified: Secondary | ICD-10-CM | POA: Diagnosis not present

## 2023-03-12 DIAGNOSIS — M542 Cervicalgia: Secondary | ICD-10-CM | POA: Diagnosis not present

## 2023-03-12 DIAGNOSIS — Z79899 Other long term (current) drug therapy: Secondary | ICD-10-CM | POA: Diagnosis not present

## 2023-03-12 DIAGNOSIS — G894 Chronic pain syndrome: Secondary | ICD-10-CM | POA: Diagnosis not present

## 2023-03-12 DIAGNOSIS — M25512 Pain in left shoulder: Secondary | ICD-10-CM | POA: Diagnosis not present

## 2023-03-12 DIAGNOSIS — K5903 Drug induced constipation: Secondary | ICD-10-CM | POA: Diagnosis not present

## 2023-03-12 DIAGNOSIS — M5137 Other intervertebral disc degeneration, lumbosacral region: Secondary | ICD-10-CM | POA: Diagnosis not present

## 2023-03-12 DIAGNOSIS — Z79891 Long term (current) use of opiate analgesic: Secondary | ICD-10-CM | POA: Diagnosis not present

## 2023-03-12 DIAGNOSIS — M25572 Pain in left ankle and joints of left foot: Secondary | ICD-10-CM | POA: Diagnosis not present

## 2023-03-12 DIAGNOSIS — M5136 Other intervertebral disc degeneration, lumbar region: Secondary | ICD-10-CM | POA: Diagnosis not present

## 2023-03-12 DIAGNOSIS — M25571 Pain in right ankle and joints of right foot: Secondary | ICD-10-CM | POA: Diagnosis not present

## 2023-03-12 DIAGNOSIS — M25511 Pain in right shoulder: Secondary | ICD-10-CM | POA: Diagnosis not present

## 2023-03-12 DIAGNOSIS — M5412 Radiculopathy, cervical region: Secondary | ICD-10-CM | POA: Diagnosis not present

## 2023-03-22 ENCOUNTER — Other Ambulatory Visit: Payer: Self-pay | Admitting: Family Medicine

## 2023-03-22 DIAGNOSIS — I10 Essential (primary) hypertension: Secondary | ICD-10-CM

## 2023-03-31 DIAGNOSIS — J9600 Acute respiratory failure, unspecified whether with hypoxia or hypercapnia: Secondary | ICD-10-CM | POA: Diagnosis not present

## 2023-04-04 ENCOUNTER — Other Ambulatory Visit: Payer: Self-pay | Admitting: Family Medicine

## 2023-04-04 DIAGNOSIS — Z8659 Personal history of other mental and behavioral disorders: Secondary | ICD-10-CM

## 2023-04-04 DIAGNOSIS — G4709 Other insomnia: Secondary | ICD-10-CM

## 2023-04-24 ENCOUNTER — Encounter: Payer: Self-pay | Admitting: Family Medicine

## 2023-04-24 DIAGNOSIS — M542 Cervicalgia: Secondary | ICD-10-CM | POA: Diagnosis not present

## 2023-04-24 DIAGNOSIS — M25512 Pain in left shoulder: Secondary | ICD-10-CM | POA: Diagnosis not present

## 2023-04-24 DIAGNOSIS — M5412 Radiculopathy, cervical region: Secondary | ICD-10-CM | POA: Diagnosis not present

## 2023-04-24 DIAGNOSIS — G894 Chronic pain syndrome: Secondary | ICD-10-CM | POA: Diagnosis not present

## 2023-04-24 DIAGNOSIS — Z79891 Long term (current) use of opiate analgesic: Secondary | ICD-10-CM | POA: Diagnosis not present

## 2023-04-24 DIAGNOSIS — M25511 Pain in right shoulder: Secondary | ICD-10-CM | POA: Diagnosis not present

## 2023-04-24 DIAGNOSIS — M545 Low back pain, unspecified: Secondary | ICD-10-CM | POA: Diagnosis not present

## 2023-04-24 DIAGNOSIS — M5137 Other intervertebral disc degeneration, lumbosacral region: Secondary | ICD-10-CM | POA: Diagnosis not present

## 2023-04-24 DIAGNOSIS — M5136 Other intervertebral disc degeneration, lumbar region: Secondary | ICD-10-CM | POA: Diagnosis not present

## 2023-04-24 DIAGNOSIS — M25572 Pain in left ankle and joints of left foot: Secondary | ICD-10-CM | POA: Diagnosis not present

## 2023-04-24 DIAGNOSIS — M25571 Pain in right ankle and joints of right foot: Secondary | ICD-10-CM | POA: Diagnosis not present

## 2023-04-24 DIAGNOSIS — Z79899 Other long term (current) drug therapy: Secondary | ICD-10-CM | POA: Diagnosis not present

## 2023-04-24 DIAGNOSIS — K5903 Drug induced constipation: Secondary | ICD-10-CM | POA: Diagnosis not present

## 2023-04-27 NOTE — Telephone Encounter (Signed)
We haven't sent him anything regarding a urine.  He was supposed to have labs completed in May.  He does need to have those done and have follow up next month.    CM

## 2023-05-01 DIAGNOSIS — J9600 Acute respiratory failure, unspecified whether with hypoxia or hypercapnia: Secondary | ICD-10-CM | POA: Diagnosis not present

## 2023-05-22 ENCOUNTER — Encounter: Payer: Self-pay | Admitting: Family Medicine

## 2023-05-22 DIAGNOSIS — M25571 Pain in right ankle and joints of right foot: Secondary | ICD-10-CM | POA: Diagnosis not present

## 2023-05-22 DIAGNOSIS — M5136 Other intervertebral disc degeneration, lumbar region: Secondary | ICD-10-CM | POA: Diagnosis not present

## 2023-05-22 DIAGNOSIS — M5412 Radiculopathy, cervical region: Secondary | ICD-10-CM | POA: Diagnosis not present

## 2023-05-22 DIAGNOSIS — M25511 Pain in right shoulder: Secondary | ICD-10-CM | POA: Diagnosis not present

## 2023-05-22 DIAGNOSIS — M545 Low back pain, unspecified: Secondary | ICD-10-CM | POA: Diagnosis not present

## 2023-05-22 DIAGNOSIS — Z79891 Long term (current) use of opiate analgesic: Secondary | ICD-10-CM | POA: Diagnosis not present

## 2023-05-22 DIAGNOSIS — M5137 Other intervertebral disc degeneration, lumbosacral region: Secondary | ICD-10-CM | POA: Diagnosis not present

## 2023-05-22 DIAGNOSIS — M542 Cervicalgia: Secondary | ICD-10-CM | POA: Diagnosis not present

## 2023-05-22 DIAGNOSIS — K5903 Drug induced constipation: Secondary | ICD-10-CM | POA: Diagnosis not present

## 2023-05-22 DIAGNOSIS — M25512 Pain in left shoulder: Secondary | ICD-10-CM | POA: Diagnosis not present

## 2023-05-22 DIAGNOSIS — E785 Hyperlipidemia, unspecified: Secondary | ICD-10-CM

## 2023-05-22 DIAGNOSIS — E119 Type 2 diabetes mellitus without complications: Secondary | ICD-10-CM

## 2023-05-22 DIAGNOSIS — M25572 Pain in left ankle and joints of left foot: Secondary | ICD-10-CM | POA: Diagnosis not present

## 2023-05-22 DIAGNOSIS — G894 Chronic pain syndrome: Secondary | ICD-10-CM | POA: Diagnosis not present

## 2023-06-01 DIAGNOSIS — J9600 Acute respiratory failure, unspecified whether with hypoxia or hypercapnia: Secondary | ICD-10-CM | POA: Diagnosis not present

## 2023-06-18 ENCOUNTER — Encounter: Payer: Self-pay | Admitting: Family Medicine

## 2023-06-18 ENCOUNTER — Ambulatory Visit (INDEPENDENT_AMBULATORY_CARE_PROVIDER_SITE_OTHER): Payer: 59 | Admitting: Family Medicine

## 2023-06-18 VITALS — BP 136/80 | HR 93 | Ht 69.0 in | Wt 223.0 lb

## 2023-06-18 DIAGNOSIS — F1910 Other psychoactive substance abuse, uncomplicated: Secondary | ICD-10-CM

## 2023-06-18 DIAGNOSIS — G8929 Other chronic pain: Secondary | ICD-10-CM

## 2023-06-18 DIAGNOSIS — I10 Essential (primary) hypertension: Secondary | ICD-10-CM | POA: Diagnosis not present

## 2023-06-18 DIAGNOSIS — F3131 Bipolar disorder, current episode depressed, mild: Secondary | ICD-10-CM | POA: Diagnosis not present

## 2023-06-18 DIAGNOSIS — E119 Type 2 diabetes mellitus without complications: Secondary | ICD-10-CM | POA: Diagnosis not present

## 2023-06-18 DIAGNOSIS — M792 Neuralgia and neuritis, unspecified: Secondary | ICD-10-CM

## 2023-06-18 DIAGNOSIS — E785 Hyperlipidemia, unspecified: Secondary | ICD-10-CM | POA: Diagnosis not present

## 2023-06-18 LAB — POCT GLYCOSYLATED HEMOGLOBIN (HGB A1C): HbA1c, POC (controlled diabetic range): 6.2 % (ref 0.0–7.0)
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
## 2023-06-18 NOTE — Assessment & Plan Note (Signed)
He has been saying that her Thekkekandam.  Remains on Lyrica as well as amitriptyline.  Fairly stable at current strength.

## 2023-06-18 NOTE — Assessment & Plan Note (Signed)
He has been managing with diet.  Blood sugars remain well-controlled.

## 2023-06-18 NOTE — Assessment & Plan Note (Signed)
Followed by [facility].  Currently on Suboxone treatment.  He is interested in tapering from this and would like to find a substance abuse retreat center.  Given information on places around the state.

## 2023-06-18 NOTE — Assessment & Plan Note (Signed)
Remains on combination of Cymbalta and Seroquel.  Stable at this time.  Will plan to continue.

## 2023-06-18 NOTE — Patient Instructions (Addendum)
[email or web address]

## 2023-06-18 NOTE — Assessment & Plan Note (Signed)
Blood pressure is fairly well-controlled at this time.  I recommend that he continue losartan at current strength.

## 2023-06-19 ENCOUNTER — Encounter: Payer: Self-pay | Admitting: Sports Medicine

## 2023-06-19 ENCOUNTER — Ambulatory Visit (INDEPENDENT_AMBULATORY_CARE_PROVIDER_SITE_OTHER): Payer: 59 | Admitting: Sports Medicine

## 2023-06-19 DIAGNOSIS — M25512 Pain in left shoulder: Secondary | ICD-10-CM | POA: Diagnosis not present

## 2023-06-19 DIAGNOSIS — G5790 Unspecified mononeuropathy of unspecified lower limb: Secondary | ICD-10-CM | POA: Diagnosis not present

## 2023-06-19 DIAGNOSIS — G894 Chronic pain syndrome: Secondary | ICD-10-CM | POA: Diagnosis not present

## 2023-06-19 DIAGNOSIS — M48062 Spinal stenosis, lumbar region with neurogenic claudication: Secondary | ICD-10-CM | POA: Diagnosis not present

## 2023-06-19 DIAGNOSIS — M25571 Pain in right ankle and joints of right foot: Secondary | ICD-10-CM | POA: Diagnosis not present

## 2023-06-19 DIAGNOSIS — G5701 Lesion of sciatic nerve, right lower limb: Secondary | ICD-10-CM | POA: Diagnosis not present

## 2023-06-19 DIAGNOSIS — M25572 Pain in left ankle and joints of left foot: Secondary | ICD-10-CM | POA: Diagnosis not present

## 2023-06-19 DIAGNOSIS — M542 Cervicalgia: Secondary | ICD-10-CM | POA: Diagnosis not present

## 2023-06-19 DIAGNOSIS — K5903 Drug induced constipation: Secondary | ICD-10-CM | POA: Diagnosis not present

## 2023-06-19 DIAGNOSIS — Z79891 Long term (current) use of opiate analgesic: Secondary | ICD-10-CM | POA: Diagnosis not present

## 2023-06-19 DIAGNOSIS — M545 Low back pain, unspecified: Secondary | ICD-10-CM | POA: Diagnosis not present

## 2023-06-19 DIAGNOSIS — M25511 Pain in right shoulder: Secondary | ICD-10-CM | POA: Diagnosis not present

## 2023-06-19 DIAGNOSIS — M5136 Other intervertebral disc degeneration, lumbar region: Secondary | ICD-10-CM | POA: Diagnosis not present

## 2023-06-19 DIAGNOSIS — M5412 Radiculopathy, cervical region: Secondary | ICD-10-CM | POA: Diagnosis not present

## 2023-06-19 LAB — CMP14+EGFR
ALT: 12 IU/L (ref 0–44)
AST: 12 IU/L (ref 0–40)
Albumin: 4.8 g/dL (ref 4.1–5.1)
Alkaline Phosphatase: 107 IU/L (ref 44–121)
BUN/Creatinine Ratio: 16 (ref 9–20)
BUN: 12 mg/dL (ref 6–24)
Bilirubin Total: 0.6 mg/dL (ref 0.0–1.2)
CO2: 24 mmol/L (ref 20–29)
Calcium: 9.6 mg/dL (ref 8.7–10.2)
Chloride: 99 mmol/L (ref 96–106)
Creatinine, Ser: 0.76 mg/dL (ref 0.76–1.27)
Globulin, Total: 2.4 g/dL (ref 1.5–4.5)
Glucose: 106 mg/dL — ABNORMAL HIGH (ref 70–99)
Potassium: 4.4 mmol/L (ref 3.5–5.2)
Sodium: 138 mmol/L (ref 134–144)
Total Protein: 7.2 g/dL (ref 6.0–8.5)
eGFR: 113 mL/min/{1.73_m2} (ref >59)

## 2023-06-19 LAB — CBC WITH DIFFERENTIAL/PLATELET
Basophils Absolute: 0 10*3/uL (ref 0.0–0.2)
Basos: 1 %
EOS (ABSOLUTE): 0.1 10*3/uL (ref 0.0–0.4)
Eos: 2 %
Hematocrit: 48.3 % (ref 37.5–51.0)
Hemoglobin: 16.1 g/dL (ref 13.0–17.7)
Immature Grans (Abs): 0 10*3/uL (ref 0.0–0.1)
Immature Granulocytes: 0 %
Lymphocytes Absolute: 2.8 10*3/uL (ref 0.7–3.1)
Lymphs: 45 %
MCH: 30 pg (ref 26.6–33.0)
MCHC: 33.3 g/dL (ref 31.5–35.7)
MCV: 90 fL (ref 79–97)
Monocytes Absolute: 0.4 10*3/uL (ref 0.1–0.9)
Monocytes: 7 %
Neutrophils Absolute: 2.9 10*3/uL (ref 1.4–7.0)
Neutrophils: 45 %
Platelets: 199 10*3/uL (ref 150–450)
RBC: 5.36 x10E6/uL (ref 4.14–5.80)
RDW: 11.9 % (ref 11.6–15.4)
WBC: 6.3 10*3/uL (ref 3.4–10.8)

## 2023-06-19 LAB — HEMOGLOBIN A1C
Est. average glucose Bld gHb Est-mCnc: 137 mg/dL
Hgb A1c MFr Bld: 6.4 % — ABNORMAL HIGH (ref 4.8–5.6)

## 2023-06-19 LAB — LIPID PANEL
Chol/HDL Ratio: 7.3 ratio — ABNORMAL HIGH (ref 0.0–5.0)
Cholesterol, Total: 242 mg/dL — ABNORMAL HIGH (ref 100–199)
HDL: 33 mg/dL — ABNORMAL LOW (ref >39)
LDL Chol Calc (NIH): 121 mg/dL — ABNORMAL HIGH (ref 0–99)
Triglycerides: 492 mg/dL — ABNORMAL HIGH (ref 0–149)
VLDL Cholesterol Cal: 88 mg/dL — ABNORMAL HIGH (ref 5–40)

## 2023-06-19 MED ORDER — AMITRIPTYLINE HCL 100 MG PO TABS
100.0000 mg | ORAL_TABLET | Freq: Every day | ORAL | 3 refills | Status: DC
Start: 2023-06-19 — End: 2024-05-27

## 2023-06-19 MED ORDER — PREGABALIN 200 MG PO CAPS: 200.0000 mg | ORAL_CAPSULE | Freq: Three times a day (TID) | ORAL | 1 refills | Status: DC

## 2023-06-19 NOTE — Assessment & Plan Note (Signed)
Pleasant 47 year old male with a chief complaint of back pain which is improving as well as right sided leg and foot pain, multilevel lumbar spinal stenosis worst L4-L5, last MRI 5 years ago, he did have some epidurals in the past, predominantly selective L5 and S1 epidurals, he has been getting mostly a medical treatment, Suboxone from pain management provider and high-dose Lyrica. He is also status post sciatic neuropathy post compartment syndrome. More recently we did a right L4-L5 interlaminar epidural that did not provide any relief. At this point I believe he is for the most maximally medically managed, we will continue Cymbalta, Lyrica 200 mg 3 times daily, I will refill his amitriptyline, I will increase the dose a bit as he endorses he is not having any sedation. I did inform him that I think we are at the limit of what I had to offer him as a sports medicine doctor, follow-up can be as needed.

## 2023-06-19 NOTE — Progress Notes (Signed)
    Procedures performed today:    None.  Independent interpretation of notes and tests performed by another provider:   None.  Brief History, Exam, Impression, and Recommendations:    Lumbar spinal stenosis Pleasant 47 year old male with a chief complaint of back pain which is improving as well as right sided leg and foot pain, multilevel lumbar spinal stenosis worst L4-L5, last MRI 5 years ago, he did have some epidurals in the past, predominantly selective L5 and S1 epidurals, he has been getting mostly a medical treatment, Suboxone from pain management provider and high-dose Lyrica. He is also status post sciatic neuropathy post compartment syndrome. More recently we did a right L4-L5 interlaminar epidural that did not provide any relief. At this point I believe he is for the most maximally medically managed, we will continue Cymbalta, Lyrica 200 mg 3 times daily, I will refill his amitriptyline, I will increase the dose a bit as he endorses he is not having any sedation. I did inform him that I think we are at the limit of what I had to offer him as a sports medicine doctor, follow-up can be as needed.    ____________________________________________ [name]. Benjamin Stain, M.D., ABFM., CAQSM., AME. Primary Care and Sports Medicine [facility]  Adjunct Professor of Family Medicine  [place] [employer] [employer]  Restaurant manager, fast food

## 2023-06-21 ENCOUNTER — Other Ambulatory Visit: Payer: Self-pay | Admitting: Sports Medicine

## 2023-06-21 DIAGNOSIS — G5701 Lesion of sciatic nerve, right lower limb: Secondary | ICD-10-CM

## 2023-06-22 ENCOUNTER — Other Ambulatory Visit: Payer: Self-pay | Admitting: Family Medicine

## 2023-06-22 MED ORDER — ATORVASTATIN CALCIUM 40 MG PO TABS: 40.0000 mg | ORAL_TABLET | Freq: Every day | ORAL | 2 refills | Status: DC

## 2023-07-01 DIAGNOSIS — J9600 Acute respiratory failure, unspecified whether with hypoxia or hypercapnia: Secondary | ICD-10-CM | POA: Diagnosis not present

## 2023-07-04 ENCOUNTER — Encounter: Payer: Self-pay | Admitting: Family Medicine

## 2023-07-04 DIAGNOSIS — F3131 Bipolar disorder, current episode depressed, mild: Secondary | ICD-10-CM

## 2023-07-08 ENCOUNTER — Other Ambulatory Visit: Payer: Self-pay | Admitting: Family Medicine

## 2023-07-08 DIAGNOSIS — G4709 Other insomnia: Secondary | ICD-10-CM

## 2023-07-08 DIAGNOSIS — Z8659 Personal history of other mental and behavioral disorders: Secondary | ICD-10-CM

## 2023-07-17 DIAGNOSIS — M5412 Radiculopathy, cervical region: Secondary | ICD-10-CM | POA: Diagnosis not present

## 2023-07-17 DIAGNOSIS — Z79891 Long term (current) use of opiate analgesic: Secondary | ICD-10-CM | POA: Diagnosis not present

## 2023-07-17 DIAGNOSIS — G894 Chronic pain syndrome: Secondary | ICD-10-CM | POA: Diagnosis not present

## 2023-07-17 DIAGNOSIS — M25512 Pain in left shoulder: Secondary | ICD-10-CM | POA: Diagnosis not present

## 2023-07-17 DIAGNOSIS — M25572 Pain in left ankle and joints of left foot: Secondary | ICD-10-CM | POA: Diagnosis not present

## 2023-07-17 DIAGNOSIS — M25511 Pain in right shoulder: Secondary | ICD-10-CM | POA: Diagnosis not present

## 2023-07-17 DIAGNOSIS — M542 Cervicalgia: Secondary | ICD-10-CM | POA: Diagnosis not present

## 2023-07-17 DIAGNOSIS — K5903 Drug induced constipation: Secondary | ICD-10-CM | POA: Diagnosis not present

## 2023-07-17 DIAGNOSIS — M25571 Pain in right ankle and joints of right foot: Secondary | ICD-10-CM | POA: Diagnosis not present

## 2023-07-17 DIAGNOSIS — M545 Low back pain, unspecified: Secondary | ICD-10-CM | POA: Diagnosis not present

## 2023-07-19 ENCOUNTER — Encounter: Payer: Self-pay | Admitting: Sports Medicine

## 2023-07-19 DIAGNOSIS — M48062 Spinal stenosis, lumbar region with neurogenic claudication: Secondary | ICD-10-CM

## 2023-08-01 DIAGNOSIS — J9600 Acute respiratory failure, unspecified whether with hypoxia or hypercapnia: Secondary | ICD-10-CM | POA: Diagnosis not present

## 2023-08-13 ENCOUNTER — Other Ambulatory Visit: Payer: Self-pay | Admitting: Family Medicine

## 2023-08-14 DIAGNOSIS — G894 Chronic pain syndrome: Secondary | ICD-10-CM | POA: Diagnosis not present

## 2023-08-14 DIAGNOSIS — G5790 Unspecified mononeuropathy of unspecified lower limb: Secondary | ICD-10-CM | POA: Diagnosis not present

## 2023-08-14 DIAGNOSIS — M51369 Other intervertebral disc degeneration, lumbar region without mention of lumbar back pain or lower extremity pain: Secondary | ICD-10-CM | POA: Diagnosis not present

## 2023-08-14 DIAGNOSIS — M5414 Radiculopathy, thoracic region: Secondary | ICD-10-CM | POA: Diagnosis not present

## 2023-08-14 DIAGNOSIS — M5126 Other intervertebral disc displacement, lumbar region: Secondary | ICD-10-CM | POA: Diagnosis not present

## 2023-08-14 DIAGNOSIS — Z79891 Long term (current) use of opiate analgesic: Secondary | ICD-10-CM | POA: Diagnosis not present

## 2023-08-21 DIAGNOSIS — G8929 Other chronic pain: Secondary | ICD-10-CM | POA: Diagnosis not present

## 2023-08-21 DIAGNOSIS — M5441 Lumbago with sciatica, right side: Secondary | ICD-10-CM | POA: Diagnosis not present

## 2023-08-29 ENCOUNTER — Other Ambulatory Visit: Payer: Self-pay | Admitting: Neurosurgery

## 2023-08-29 ENCOUNTER — Encounter: Payer: Self-pay | Admitting: Neurosurgery

## 2023-08-29 DIAGNOSIS — G8929 Other chronic pain: Secondary | ICD-10-CM

## 2023-08-31 DIAGNOSIS — J9600 Acute respiratory failure, unspecified whether with hypoxia or hypercapnia: Secondary | ICD-10-CM | POA: Diagnosis not present

## 2023-09-02 ENCOUNTER — Other Ambulatory Visit: Payer: Self-pay | Admitting: Family Medicine

## 2023-09-11 DIAGNOSIS — G894 Chronic pain syndrome: Secondary | ICD-10-CM | POA: Diagnosis not present

## 2023-09-11 DIAGNOSIS — M5126 Other intervertebral disc displacement, lumbar region: Secondary | ICD-10-CM | POA: Diagnosis not present

## 2023-09-11 DIAGNOSIS — G5790 Unspecified mononeuropathy of unspecified lower limb: Secondary | ICD-10-CM | POA: Diagnosis not present

## 2023-09-11 DIAGNOSIS — M5414 Radiculopathy, thoracic region: Secondary | ICD-10-CM | POA: Diagnosis not present

## 2023-09-11 DIAGNOSIS — Z79891 Long term (current) use of opiate analgesic: Secondary | ICD-10-CM | POA: Diagnosis not present

## 2023-09-12 NOTE — Discharge Instructions (Signed)

## 2023-09-13 ENCOUNTER — Encounter: Payer: Self-pay | Admitting: Sports Medicine

## 2023-09-13 ENCOUNTER — Ambulatory Visit
Admission: RE | Admit: 2023-09-13 | Discharge: 2023-09-13 | Disposition: A | Discharge status: Home / Self Care | Payer: 59 | Source: Ambulatory Visit | Attending: Neurosurgery | Admitting: Neurosurgery

## 2023-09-13 DIAGNOSIS — I700 Atherosclerosis of aorta: Secondary | ICD-10-CM | POA: Diagnosis not present

## 2023-09-13 DIAGNOSIS — M5431 Sciatica, right side: Secondary | ICD-10-CM | POA: Diagnosis not present

## 2023-09-13 DIAGNOSIS — M48062 Spinal stenosis, lumbar region with neurogenic claudication: Secondary | ICD-10-CM

## 2023-09-13 DIAGNOSIS — G8929 Other chronic pain: Secondary | ICD-10-CM

## 2023-09-13 DIAGNOSIS — M48061 Spinal stenosis, lumbar region without neurogenic claudication: Secondary | ICD-10-CM | POA: Diagnosis not present

## 2023-09-13 DIAGNOSIS — E882 Lipomatosis, not elsewhere classified: Secondary | ICD-10-CM | POA: Diagnosis not present

## 2023-09-13 DIAGNOSIS — G5701 Lesion of sciatic nerve, right lower limb: Secondary | ICD-10-CM

## 2023-09-13 MED ORDER — DIAZEPAM 5 MG PO TABS: 10.0000 mg | ORAL_TABLET | Freq: Once | ORAL | Status: DC

## 2023-09-13 MED ORDER — MEPERIDINE HCL 50 MG/ML IJ SOLN: 50.0000 mg | Freq: Once | INTRAMUSCULAR | Status: DC | PRN

## 2023-09-13 MED ORDER — PREGABALIN 200 MG PO CAPS: 200.0000 mg | ORAL_CAPSULE | Freq: Three times a day (TID) | ORAL | 1 refills | Status: DC

## 2023-09-13 MED ORDER — ONDANSETRON HCL 4 MG/2ML IJ SOLN: 4.0000 mg | Freq: Once | INTRAMUSCULAR | Status: DC | PRN

## 2023-09-13 MED ORDER — IOPAMIDOL (ISOVUE-M 200) INJECTION 41%
20.0000 mL | Freq: Once | INTRAMUSCULAR | Status: AC
  Administered 2023-09-13: 20 mL via INTRATHECAL

## 2023-09-13 NOTE — Addendum Note (Signed)
Addended by: Monica Becton on: 09/13/2023 04:50 PM   Modules accepted: Orders

## 2023-09-13 NOTE — Telephone Encounter (Signed)
Please see patient message attached Requesting rx rf of Lyrica Last written [id number] Last OV 06/19/2023 Next schld appt = none

## 2023-09-16 ENCOUNTER — Other Ambulatory Visit: Payer: Self-pay | Admitting: Family Medicine

## 2023-09-16 DIAGNOSIS — I10 Essential (primary) hypertension: Secondary | ICD-10-CM

## 2023-09-30 ENCOUNTER — Encounter: Payer: Self-pay | Admitting: Family Medicine

## 2023-10-01 DIAGNOSIS — J9600 Acute respiratory failure, unspecified whether with hypoxia or hypercapnia: Secondary | ICD-10-CM | POA: Diagnosis not present

## 2023-10-09 DIAGNOSIS — K5903 Drug induced constipation: Secondary | ICD-10-CM | POA: Diagnosis not present

## 2023-10-09 DIAGNOSIS — G894 Chronic pain syndrome: Secondary | ICD-10-CM | POA: Diagnosis not present

## 2023-10-09 DIAGNOSIS — Z79891 Long term (current) use of opiate analgesic: Secondary | ICD-10-CM | POA: Diagnosis not present

## 2023-10-09 DIAGNOSIS — M542 Cervicalgia: Secondary | ICD-10-CM | POA: Diagnosis not present

## 2023-10-18 DIAGNOSIS — M48061 Spinal stenosis, lumbar region without neurogenic claudication: Secondary | ICD-10-CM | POA: Diagnosis not present

## 2023-10-29 ENCOUNTER — Encounter: Payer: Self-pay | Admitting: Family Medicine

## 2023-10-29 DIAGNOSIS — E119 Type 2 diabetes mellitus without complications: Secondary | ICD-10-CM

## 2023-10-29 DIAGNOSIS — R739 Hyperglycemia, unspecified: Secondary | ICD-10-CM

## 2023-10-30 MED ORDER — METFORMIN HCL ER 500 MG PO TB24
1000.0000 mg | ORAL_TABLET | Freq: Every day | ORAL | 3 refills | Status: DC
Start: 2023-10-30 — End: 2024-02-29

## 2023-10-30 NOTE — Telephone Encounter (Signed)
Metformin is not listed in active med list. A MyChart visit has been sent to the patient to schedule an appointment with the provider. Last DM check was 06/19/23.

## 2023-11-01 DIAGNOSIS — J9600 Acute respiratory failure, unspecified whether with hypoxia or hypercapnia: Secondary | ICD-10-CM | POA: Diagnosis not present

## 2023-11-06 DIAGNOSIS — Z79891 Long term (current) use of opiate analgesic: Secondary | ICD-10-CM | POA: Diagnosis not present

## 2023-11-06 DIAGNOSIS — M25572 Pain in left ankle and joints of left foot: Secondary | ICD-10-CM | POA: Diagnosis not present

## 2023-11-06 DIAGNOSIS — G894 Chronic pain syndrome: Secondary | ICD-10-CM | POA: Diagnosis not present

## 2023-11-06 DIAGNOSIS — M542 Cervicalgia: Secondary | ICD-10-CM | POA: Diagnosis not present

## 2023-11-29 DIAGNOSIS — J9600 Acute respiratory failure, unspecified whether with hypoxia or hypercapnia: Secondary | ICD-10-CM | POA: Diagnosis not present

## 2023-12-04 DIAGNOSIS — G894 Chronic pain syndrome: Secondary | ICD-10-CM | POA: Diagnosis not present

## 2023-12-04 DIAGNOSIS — M545 Low back pain, unspecified: Secondary | ICD-10-CM | POA: Diagnosis not present

## 2023-12-04 DIAGNOSIS — M25572 Pain in left ankle and joints of left foot: Secondary | ICD-10-CM | POA: Diagnosis not present

## 2023-12-04 DIAGNOSIS — M25511 Pain in right shoulder: Secondary | ICD-10-CM | POA: Diagnosis not present

## 2023-12-04 DIAGNOSIS — Z79891 Long term (current) use of opiate analgesic: Secondary | ICD-10-CM | POA: Diagnosis not present

## 2023-12-13 ENCOUNTER — Other Ambulatory Visit: Payer: Self-pay | Admitting: Family Medicine

## 2023-12-30 DIAGNOSIS — J9600 Acute respiratory failure, unspecified whether with hypoxia or hypercapnia: Secondary | ICD-10-CM | POA: Diagnosis not present

## 2024-01-01 DIAGNOSIS — G894 Chronic pain syndrome: Secondary | ICD-10-CM | POA: Diagnosis not present

## 2024-01-01 DIAGNOSIS — M542 Cervicalgia: Secondary | ICD-10-CM | POA: Diagnosis not present

## 2024-01-01 DIAGNOSIS — M545 Low back pain, unspecified: Secondary | ICD-10-CM | POA: Diagnosis not present

## 2024-01-01 DIAGNOSIS — M25572 Pain in left ankle and joints of left foot: Secondary | ICD-10-CM | POA: Diagnosis not present

## 2024-01-01 DIAGNOSIS — Z79891 Long term (current) use of opiate analgesic: Secondary | ICD-10-CM | POA: Diagnosis not present

## 2024-01-03 ENCOUNTER — Other Ambulatory Visit: Payer: Self-pay | Admitting: Family Medicine

## 2024-01-03 DIAGNOSIS — G4709 Other insomnia: Secondary | ICD-10-CM

## 2024-01-03 DIAGNOSIS — Z8659 Personal history of other mental and behavioral disorders: Secondary | ICD-10-CM

## 2024-01-04 NOTE — Telephone Encounter (Signed)
 Scheduled

## 2024-01-04 NOTE — Telephone Encounter (Signed)
 Pls contact the pt to schedule appt with Dr. Ashley Royalty. 46-month follow-up. Thx

## 2024-01-07 ENCOUNTER — Ambulatory Visit (INDEPENDENT_AMBULATORY_CARE_PROVIDER_SITE_OTHER): Admitting: Family Medicine

## 2024-01-07 ENCOUNTER — Encounter: Payer: Self-pay | Admitting: Family Medicine

## 2024-01-07 VITALS — BP 118/75 | HR 104 | Ht 69.0 in | Wt 247.0 lb

## 2024-01-07 DIAGNOSIS — L0591 Pilonidal cyst without abscess: Secondary | ICD-10-CM | POA: Diagnosis not present

## 2024-01-07 DIAGNOSIS — G8929 Other chronic pain: Secondary | ICD-10-CM | POA: Diagnosis not present

## 2024-01-07 DIAGNOSIS — M792 Neuralgia and neuritis, unspecified: Secondary | ICD-10-CM | POA: Diagnosis not present

## 2024-01-07 DIAGNOSIS — E119 Type 2 diabetes mellitus without complications: Secondary | ICD-10-CM

## 2024-01-07 LAB — POCT GLYCOSYLATED HEMOGLOBIN (HGB A1C): HbA1c, POC (controlled diabetic range): 10.1 % — AB (ref 0.0–7.0)

## 2024-01-07 MED ORDER — MOUNJARO 5 MG/0.5ML ~~LOC~~ SOAJ: 5.0000 mg | SUBCUTANEOUS | 0 refills | Status: DC

## 2024-01-07 MED ORDER — GABAPENTIN 600 MG PO TABS: ORAL_TABLET | ORAL | 1 refills | Status: DC

## 2024-01-07 MED ORDER — DOXYCYCLINE HYCLATE 100 MG PO TABS
100.0000 mg | ORAL_TABLET | Freq: Two times a day (BID) | ORAL | 0 refills | Status: DC
Start: 2024-01-07 — End: 2024-02-06

## 2024-01-07 MED ORDER — MOUNJARO 2.5 MG/0.5ML ~~LOC~~ SOAJ: 2.5000 mg | SUBCUTANEOUS | 0 refills | Status: DC

## 2024-01-07 MED ORDER — MOUNJARO 7.5 MG/0.5ML ~~LOC~~ SOAJ: 7.5000 mg | SUBCUTANEOUS | 0 refills | Status: DC

## 2024-01-07 NOTE — Assessment & Plan Note (Signed)
 Blood sugars are poorly controlled.  Adding Mounjaro with titration over the next few months.  Continue metformin at current strength.

## 2024-01-07 NOTE — Assessment & Plan Note (Signed)
 Treated with course of doxycycline and will refer to general surgery for excision of pilonidal cyst.

## 2024-01-07 NOTE — Patient Instructions (Addendum)
 You can use an additional Gabapentin if needed.  Not intended to be used every day.  Start doxycycline for area on buttock Referral placed to surgery clinic.  Recommend adding Mounjaro to help with blood sugar control.  This is a once weekly injectable medication.
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
## 2024-01-07 NOTE — Assessment & Plan Note (Signed)
 He will continue Lyrica at current strength.  Adding an additional dose of gabapentin daily as needed.  We discussed this is not for every day use.  Current management of Suboxone under direction of pain clinic.  Continue duloxetine.

## 2024-01-10 NOTE — Progress Notes (Signed)
 [facility] Quality Team Note  Name: Bobby Shaw Date of Birth: 1975/10/26 MRN: [id number] Date: 01/10/2024  [facility] Quality Team has reviewed this patient's chart, please see recommendations below:  [facility] Quality Other; (Patient needs Urine Microalbumin/Creatinine ratio, eGFR, A1c of 9.0 or less and colon cancer screening. Please make a note to have these gaps addressed at upcoming 05/08/2024 appt with pcp)

## 2024-01-29 DIAGNOSIS — J9600 Acute respiratory failure, unspecified whether with hypoxia or hypercapnia: Secondary | ICD-10-CM | POA: Diagnosis not present

## 2024-01-29 DIAGNOSIS — G894 Chronic pain syndrome: Secondary | ICD-10-CM | POA: Diagnosis not present

## 2024-01-29 DIAGNOSIS — M25572 Pain in left ankle and joints of left foot: Secondary | ICD-10-CM | POA: Diagnosis not present

## 2024-01-29 DIAGNOSIS — Z79891 Long term (current) use of opiate analgesic: Secondary | ICD-10-CM | POA: Diagnosis not present

## 2024-01-29 DIAGNOSIS — M25511 Pain in right shoulder: Secondary | ICD-10-CM | POA: Diagnosis not present

## 2024-01-29 DIAGNOSIS — M25512 Pain in left shoulder: Secondary | ICD-10-CM | POA: Diagnosis not present

## 2024-01-31 ENCOUNTER — Other Ambulatory Visit: Payer: Self-pay | Admitting: Family Medicine

## 2024-01-31 DIAGNOSIS — Z8659 Personal history of other mental and behavioral disorders: Secondary | ICD-10-CM

## 2024-01-31 DIAGNOSIS — G4709 Other insomnia: Secondary | ICD-10-CM

## 2024-02-01 ENCOUNTER — Other Ambulatory Visit: Payer: Self-pay | Admitting: Family Medicine

## 2024-02-06 ENCOUNTER — Ambulatory Visit (INDEPENDENT_AMBULATORY_CARE_PROVIDER_SITE_OTHER): Admitting: Family Medicine

## 2024-02-06 ENCOUNTER — Encounter: Payer: Self-pay | Admitting: Family Medicine

## 2024-02-06 VITALS — BP 122/74 | HR 87 | Ht 69.0 in | Wt 247.0 lb

## 2024-02-06 DIAGNOSIS — M5442 Lumbago with sciatica, left side: Secondary | ICD-10-CM

## 2024-02-06 DIAGNOSIS — G8929 Other chronic pain: Secondary | ICD-10-CM | POA: Insufficient documentation
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
## 2024-02-06 NOTE — Assessment & Plan Note (Signed)
 I discussed with him that I do not have anything additional to offer for management of his pain.  I will update imaging of his lumbar spine, plain films ordered.

## 2024-02-07 ENCOUNTER — Other Ambulatory Visit: Payer: Self-pay | Admitting: Family Medicine

## 2024-02-07 NOTE — Telephone Encounter (Signed)
 Copied from CRM [id number]. Topic: Clinical - Medication Refill >> Feb 07, 2024 12:01 PM Retta Caster wrote: Medication: tirzepatide (MOUNJARO) 5 MG/0.5ML Pen   Has the patient contacted their pharmacy? Yes (Agent: If no, request that the patient contact the pharmacy for the refill. If patient does not wish to contact the pharmacy document the reason why and proceed with request.) (Agent: If yes, when and what did the pharmacy advise?)  This is the patient's preferred pharmacy:  [facility] [address] [place] - [address] [place] [address] Phone: [phone] Fax: [phone]  Is this the correct pharmacy for this prescription? Yes If no, delete pharmacy and type the correct one.   Has the prescription been filled recently? No  Is the patient out of the medication? No  Has the patient been seen for an appointment in the last year OR does the patient have an upcoming appointment? Yes  Can we respond through MyChart? Yes  Agent: Please be advised that Rx refills may take up to 3 business days. We ask that you follow-up with your pharmacy.

## 2024-02-10 ENCOUNTER — Encounter: Payer: Self-pay | Admitting: Family Medicine

## 2024-02-25 ENCOUNTER — Encounter: Payer: Self-pay | Admitting: Family Medicine

## 2024-02-27 NOTE — Telephone Encounter (Signed)
 Called pt and LVM to advise him that he will need to schedule an appt to be seen about the medication issues. Asked that he call the office to schedule this with one of the providers since Dr. Augustus Ledger is not here.

## 2024-02-29 ENCOUNTER — Other Ambulatory Visit: Payer: Self-pay | Admitting: Family Medicine

## 2024-02-29 DIAGNOSIS — J9600 Acute respiratory failure, unspecified whether with hypoxia or hypercapnia: Secondary | ICD-10-CM | POA: Diagnosis not present

## 2024-02-29 DIAGNOSIS — R739 Hyperglycemia, unspecified: Secondary | ICD-10-CM

## 2024-02-29 DIAGNOSIS — E119 Type 2 diabetes mellitus without complications: Secondary | ICD-10-CM

## 2024-03-02 ENCOUNTER — Other Ambulatory Visit: Payer: Self-pay | Admitting: Family Medicine

## 2024-03-07 ENCOUNTER — Other Ambulatory Visit: Payer: Self-pay | Admitting: Family Medicine

## 2024-03-07 DIAGNOSIS — I10 Essential (primary) hypertension: Secondary | ICD-10-CM

## 2024-03-11 DIAGNOSIS — Z79891 Long term (current) use of opiate analgesic: Secondary | ICD-10-CM | POA: Diagnosis not present

## 2024-03-11 DIAGNOSIS — G894 Chronic pain syndrome: Secondary | ICD-10-CM | POA: Diagnosis not present

## 2024-03-11 DIAGNOSIS — M25512 Pain in left shoulder: Secondary | ICD-10-CM | POA: Diagnosis not present

## 2024-03-11 DIAGNOSIS — M545 Low back pain, unspecified: Secondary | ICD-10-CM | POA: Diagnosis not present

## 2024-03-12 ENCOUNTER — Encounter: Payer: Self-pay | Admitting: Family Medicine

## 2024-03-12 ENCOUNTER — Ambulatory Visit: Admitting: Family Medicine

## 2024-03-15 ENCOUNTER — Other Ambulatory Visit: Payer: Self-pay | Admitting: Family Medicine

## 2024-03-25 ENCOUNTER — Ambulatory Visit (INDEPENDENT_AMBULATORY_CARE_PROVIDER_SITE_OTHER): Admitting: Family Medicine

## 2024-03-25 VITALS — BP 95/63 | HR 97 | Ht 69.0 in | Wt 231.0 lb

## 2024-03-25 DIAGNOSIS — R109 Unspecified abdominal pain: Secondary | ICD-10-CM

## 2024-03-25 DIAGNOSIS — R350 Frequency of micturition: Secondary | ICD-10-CM | POA: Diagnosis not present

## 2024-03-25 DIAGNOSIS — E119 Type 2 diabetes mellitus without complications: Secondary | ICD-10-CM | POA: Diagnosis not present

## 2024-03-30 ENCOUNTER — Encounter: Payer: Self-pay | Admitting: Family Medicine

## 2024-03-30 DIAGNOSIS — J9600 Acute respiratory failure, unspecified whether with hypoxia or hypercapnia: Secondary | ICD-10-CM | POA: Diagnosis not present

## 2024-03-30 NOTE — Assessment & Plan Note (Signed)
 Unable to provide urine sample in clinic today.  Provided with sterile cup and he will bring a sample back in.

## 2024-03-30 NOTE — Assessment & Plan Note (Signed)
 He is tolerating Mounjaro  better at this point.  Will plan to continue this at current strength.  Will titrate based on tolerance.
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
## 2024-03-31 ENCOUNTER — Other Ambulatory Visit: Payer: Self-pay | Admitting: Family Medicine

## 2024-04-01 ENCOUNTER — Other Ambulatory Visit: Payer: Self-pay

## 2024-04-01 ENCOUNTER — Ambulatory Visit: Payer: Self-pay | Admitting: Family Medicine

## 2024-04-01 DIAGNOSIS — R109 Unspecified abdominal pain: Secondary | ICD-10-CM

## 2024-04-01 LAB — POCT URINALYSIS DIP (CLINITEK)
Bilirubin, UA: NEGATIVE
Blood, UA: NEGATIVE
Glucose, UA: NEGATIVE mg/dL
Ketones, POC UA: NEGATIVE mg/dL
Leukocytes, UA: NEGATIVE
Nitrite, UA: NEGATIVE
POC PROTEIN,UA: NEGATIVE
Spec Grav, UA: 1.025 (ref 1.010–1.025)
Urobilinogen, UA: 0.2 U/dL
pH, UA: 6 (ref 5.0–8.0)

## 2024-04-01 NOTE — Addendum Note (Signed)
 Addended by: Jennavie Martinek Z on: 04/01/2024 04:08 PM   Modules accepted: Orders

## 2024-04-01 NOTE — Addendum Note (Signed)
 Addended by: Lendy Dittrich Z on: 04/01/2024 04:10 PM   Modules accepted: Orders

## 2024-04-06 ENCOUNTER — Other Ambulatory Visit: Payer: Self-pay | Admitting: Sports Medicine

## 2024-04-06 DIAGNOSIS — M48062 Spinal stenosis, lumbar region with neurogenic claudication: Secondary | ICD-10-CM

## 2024-04-06 DIAGNOSIS — G5701 Lesion of sciatic nerve, right lower limb: Secondary | ICD-10-CM

## 2024-04-18 DIAGNOSIS — M5136 Other intervertebral disc degeneration, lumbar region with discogenic back pain only: Secondary | ICD-10-CM | POA: Diagnosis not present

## 2024-04-18 DIAGNOSIS — Z79891 Long term (current) use of opiate analgesic: Secondary | ICD-10-CM | POA: Diagnosis not present

## 2024-04-18 DIAGNOSIS — I10 Essential (primary) hypertension: Secondary | ICD-10-CM | POA: Diagnosis not present

## 2024-04-18 DIAGNOSIS — G894 Chronic pain syndrome: Secondary | ICD-10-CM | POA: Diagnosis not present

## 2024-04-18 DIAGNOSIS — M5126 Other intervertebral disc displacement, lumbar region: Secondary | ICD-10-CM | POA: Diagnosis not present

## 2024-04-18 DIAGNOSIS — R52 Pain, unspecified: Secondary | ICD-10-CM | POA: Diagnosis not present

## 2024-04-18 DIAGNOSIS — M5414 Radiculopathy, thoracic region: Secondary | ICD-10-CM | POA: Diagnosis not present

## 2024-04-18 DIAGNOSIS — E559 Vitamin D deficiency, unspecified: Secondary | ICD-10-CM | POA: Diagnosis not present

## 2024-04-18 DIAGNOSIS — R5383 Other fatigue: Secondary | ICD-10-CM | POA: Diagnosis not present

## 2024-04-18 DIAGNOSIS — G603 Idiopathic progressive neuropathy: Secondary | ICD-10-CM | POA: Diagnosis not present

## 2024-04-26 ENCOUNTER — Other Ambulatory Visit: Payer: Self-pay | Admitting: Family Medicine

## 2024-04-26 DIAGNOSIS — E119 Type 2 diabetes mellitus without complications: Secondary | ICD-10-CM

## 2024-04-28 ENCOUNTER — Other Ambulatory Visit: Payer: Self-pay | Admitting: Family Medicine

## 2024-04-28 DIAGNOSIS — Z8659 Personal history of other mental and behavioral disorders: Secondary | ICD-10-CM

## 2024-04-28 DIAGNOSIS — G4709 Other insomnia: Secondary | ICD-10-CM

## 2024-04-29 ENCOUNTER — Other Ambulatory Visit: Payer: Self-pay | Admitting: Family Medicine

## 2024-04-30 DIAGNOSIS — J9600 Acute respiratory failure, unspecified whether with hypoxia or hypercapnia: Secondary | ICD-10-CM | POA: Diagnosis not present

## 2024-05-08 ENCOUNTER — Ambulatory Visit: Admitting: Family Medicine

## 2024-05-16 DIAGNOSIS — G5790 Unspecified mononeuropathy of unspecified lower limb: Secondary | ICD-10-CM | POA: Diagnosis not present

## 2024-05-16 DIAGNOSIS — M5136 Other intervertebral disc degeneration, lumbar region with discogenic back pain only: Secondary | ICD-10-CM | POA: Diagnosis not present

## 2024-05-16 DIAGNOSIS — M5126 Other intervertebral disc displacement, lumbar region: Secondary | ICD-10-CM | POA: Diagnosis not present

## 2024-05-16 DIAGNOSIS — M5414 Radiculopathy, thoracic region: Secondary | ICD-10-CM | POA: Diagnosis not present

## 2024-05-22 ENCOUNTER — Other Ambulatory Visit: Payer: Self-pay | Admitting: Family Medicine

## 2024-05-22 DIAGNOSIS — E119 Type 2 diabetes mellitus without complications: Secondary | ICD-10-CM

## 2024-05-27 ENCOUNTER — Other Ambulatory Visit: Payer: Self-pay

## 2024-05-27 ENCOUNTER — Encounter: Payer: Self-pay | Admitting: Sports Medicine

## 2024-05-27 DIAGNOSIS — M48062 Spinal stenosis, lumbar region with neurogenic claudication: Secondary | ICD-10-CM

## 2024-05-27 DIAGNOSIS — G5701 Lesion of sciatic nerve, right lower limb: Secondary | ICD-10-CM

## 2024-05-27 MED ORDER — AMITRIPTYLINE HCL 100 MG PO TABS: 100.0000 mg | ORAL_TABLET | Freq: Every day | ORAL | 3 refills | Status: AC

## 2024-06-05 ENCOUNTER — Encounter: Payer: Self-pay | Admitting: Family Medicine

## 2024-06-05 ENCOUNTER — Other Ambulatory Visit: Payer: Self-pay | Admitting: Family Medicine

## 2024-06-05 DIAGNOSIS — I10 Essential (primary) hypertension: Secondary | ICD-10-CM

## 2024-06-05 DIAGNOSIS — M48062 Spinal stenosis, lumbar region with neurogenic claudication: Secondary | ICD-10-CM

## 2024-06-05 DIAGNOSIS — G5701 Lesion of sciatic nerve, right lower limb: Secondary | ICD-10-CM

## 2024-06-09 ENCOUNTER — Other Ambulatory Visit: Payer: Self-pay | Admitting: Family Medicine

## 2024-06-09 MED ORDER — PREGABALIN 200 MG PO CAPS
200.0000 mg | ORAL_CAPSULE | Freq: Three times a day (TID) | ORAL | Status: AC
Start: 2024-06-09 — End: ?

## 2024-06-13 DIAGNOSIS — G5790 Unspecified mononeuropathy of unspecified lower limb: Secondary | ICD-10-CM | POA: Diagnosis not present

## 2024-06-13 DIAGNOSIS — M5126 Other intervertebral disc displacement, lumbar region: Secondary | ICD-10-CM | POA: Diagnosis not present

## 2024-06-13 DIAGNOSIS — M5136 Other intervertebral disc degeneration, lumbar region with discogenic back pain only: Secondary | ICD-10-CM | POA: Diagnosis not present

## 2024-06-13 DIAGNOSIS — M5414 Radiculopathy, thoracic region: Secondary | ICD-10-CM | POA: Diagnosis not present

## 2024-06-24 ENCOUNTER — Other Ambulatory Visit: Payer: Self-pay | Admitting: Family Medicine

## 2024-06-24 DIAGNOSIS — R739 Hyperglycemia, unspecified: Secondary | ICD-10-CM

## 2024-06-24 DIAGNOSIS — E119 Type 2 diabetes mellitus without complications: Secondary | ICD-10-CM

## 2024-06-27 ENCOUNTER — Other Ambulatory Visit: Payer: Self-pay | Admitting: Family Medicine

## 2024-06-30 DIAGNOSIS — J9600 Acute respiratory failure, unspecified whether with hypoxia or hypercapnia: Secondary | ICD-10-CM | POA: Diagnosis not present

## 2024-07-01 ENCOUNTER — Telehealth: Payer: Self-pay

## 2024-07-01 NOTE — Telephone Encounter (Signed)
 I called and left a message for a return call about Colon Cancer Screening.

## 2024-07-11 DIAGNOSIS — M5136 Other intervertebral disc degeneration, lumbar region with discogenic back pain only: Secondary | ICD-10-CM | POA: Diagnosis not present

## 2024-07-11 DIAGNOSIS — M5414 Radiculopathy, thoracic region: Secondary | ICD-10-CM | POA: Diagnosis not present

## 2024-07-11 DIAGNOSIS — M5126 Other intervertebral disc displacement, lumbar region: Secondary | ICD-10-CM | POA: Diagnosis not present

## 2024-07-11 DIAGNOSIS — K5903 Drug induced constipation: Secondary | ICD-10-CM | POA: Diagnosis not present

## 2024-07-22 ENCOUNTER — Encounter: Payer: Self-pay | Admitting: Family Medicine

## 2024-07-22 DIAGNOSIS — F3131 Bipolar disorder, current episode depressed, mild: Secondary | ICD-10-CM

## 2024-07-23 ENCOUNTER — Other Ambulatory Visit: Payer: Self-pay | Admitting: Family Medicine

## 2024-07-25 ENCOUNTER — Other Ambulatory Visit: Payer: Self-pay | Admitting: Family Medicine

## 2024-07-25 DIAGNOSIS — E119 Type 2 diabetes mellitus without complications: Secondary | ICD-10-CM

## 2024-08-23 ENCOUNTER — Other Ambulatory Visit: Payer: Self-pay | Admitting: Family Medicine

## 2024-08-23 DIAGNOSIS — Z8659 Personal history of other mental and behavioral disorders: Secondary | ICD-10-CM

## 2024-08-23 DIAGNOSIS — G4709 Other insomnia: Secondary | ICD-10-CM

## 2024-08-26 ENCOUNTER — Other Ambulatory Visit: Payer: Self-pay | Admitting: Family Medicine

## 2024-08-26 DIAGNOSIS — E119 Type 2 diabetes mellitus without complications: Secondary | ICD-10-CM

## 2024-09-07 ENCOUNTER — Other Ambulatory Visit: Payer: Self-pay | Admitting: Family Medicine

## 2024-09-07 DIAGNOSIS — I10 Essential (primary) hypertension: Secondary | ICD-10-CM

## 2024-09-10 ENCOUNTER — Other Ambulatory Visit: Payer: Self-pay | Admitting: Family Medicine

## 2024-09-26 ENCOUNTER — Other Ambulatory Visit: Payer: Self-pay | Admitting: Family Medicine

## 2024-09-26 DIAGNOSIS — E785 Hyperlipidemia, unspecified: Secondary | ICD-10-CM

## 2024-09-30 DIAGNOSIS — E119 Type 2 diabetes mellitus without complications: Secondary | ICD-10-CM

## 2024-09-30 MED ORDER — MOUNJARO 5 MG/0.5ML ~~LOC~~ SOAJ: SUBCUTANEOUS | 0 refills | Status: AC

## 2024-10-07 ENCOUNTER — Encounter: Payer: Self-pay | Admitting: Family Medicine

## 2024-10-07 DIAGNOSIS — Z8659 Personal history of other mental and behavioral disorders: Secondary | ICD-10-CM

## 2024-10-07 DIAGNOSIS — G4709 Other insomnia: Secondary | ICD-10-CM

## 2024-10-07 MED ORDER — QUETIAPINE FUMARATE 100 MG PO TABS: 200.0000 mg | ORAL_TABLET | Freq: Every day | ORAL | 0 refills | Status: AC
